# Patient Record
Sex: Male | Born: 1960
Health system: Southern US, Community
[De-identification: ages and names within clinical notes are randomized; demographics above are authoritative.]

## PROBLEM LIST (undated history)

## (undated) DIAGNOSIS — I10 Essential (primary) hypertension: Secondary | ICD-10-CM

## (undated) DIAGNOSIS — E119 Type 2 diabetes mellitus without complications: Secondary | ICD-10-CM

## (undated) DIAGNOSIS — N400 Enlarged prostate without lower urinary tract symptoms: Secondary | ICD-10-CM

## (undated) DIAGNOSIS — E785 Hyperlipidemia, unspecified: Secondary | ICD-10-CM

## (undated) HISTORY — DX: Benign prostatic hyperplasia without lower urinary tract symptoms: N40.0

## (undated) HISTORY — DX: Essential (primary) hypertension: I10

## (undated) HISTORY — DX: Type 2 diabetes mellitus without complications: E11.9

## (undated) HISTORY — DX: Hyperlipidemia, unspecified: E78.5

---

## 2008-03-24 ENCOUNTER — Ambulatory Visit: Payer: Self-pay | Admitting: Cardiology

## 2008-03-24 ENCOUNTER — Inpatient Hospital Stay (HOSPITAL_COMMUNITY): Admission: EM | Admit: 2008-03-24 | Discharge: 2008-04-04 | Payer: Self-pay | Admitting: Cardiology

## 2008-03-26 ENCOUNTER — Ambulatory Visit: Payer: Self-pay | Admitting: Thoracic Surgery (Cardiothoracic Vascular Surgery)

## 2008-04-22 ENCOUNTER — Ambulatory Visit: Payer: Self-pay | Admitting: Cardiothoracic Surgery

## 2008-04-22 ENCOUNTER — Encounter: Admission: RE | Admit: 2008-04-22 | Discharge: 2008-04-22 | Payer: Self-pay | Admitting: Cardiothoracic Surgery

## 2008-04-22 ENCOUNTER — Ambulatory Visit: Payer: Self-pay | Admitting: Cardiology

## 2008-05-12 ENCOUNTER — Ambulatory Visit: Payer: Self-pay | Admitting: Pulmonary Disease

## 2008-05-12 DIAGNOSIS — G4733 Obstructive sleep apnea (adult) (pediatric): Secondary | ICD-10-CM | POA: Insufficient documentation

## 2008-05-12 DIAGNOSIS — I219 Acute myocardial infarction, unspecified: Secondary | ICD-10-CM | POA: Insufficient documentation

## 2008-05-12 DIAGNOSIS — E785 Hyperlipidemia, unspecified: Secondary | ICD-10-CM

## 2008-05-24 ENCOUNTER — Ambulatory Visit (HOSPITAL_BASED_OUTPATIENT_CLINIC_OR_DEPARTMENT_OTHER): Admission: RE | Admit: 2008-05-24 | Discharge: 2008-05-24 | Payer: Self-pay | Admitting: Pulmonary Disease

## 2008-05-24 ENCOUNTER — Encounter: Payer: Self-pay | Admitting: Pulmonary Disease

## 2008-06-07 ENCOUNTER — Ambulatory Visit: Payer: Self-pay | Admitting: Pulmonary Disease

## 2008-06-10 ENCOUNTER — Ambulatory Visit: Payer: Self-pay | Admitting: Pulmonary Disease

## 2009-09-21 IMAGING — CR DG CHEST 1V PORT
1 series · 1 of 1 positions shown · non-contrast
Comparison: 03/29/2008

CLINICAL DATA: Post CABG for chest pain

PORTABLE CHEST - 1 VIEW

[view not recorded]
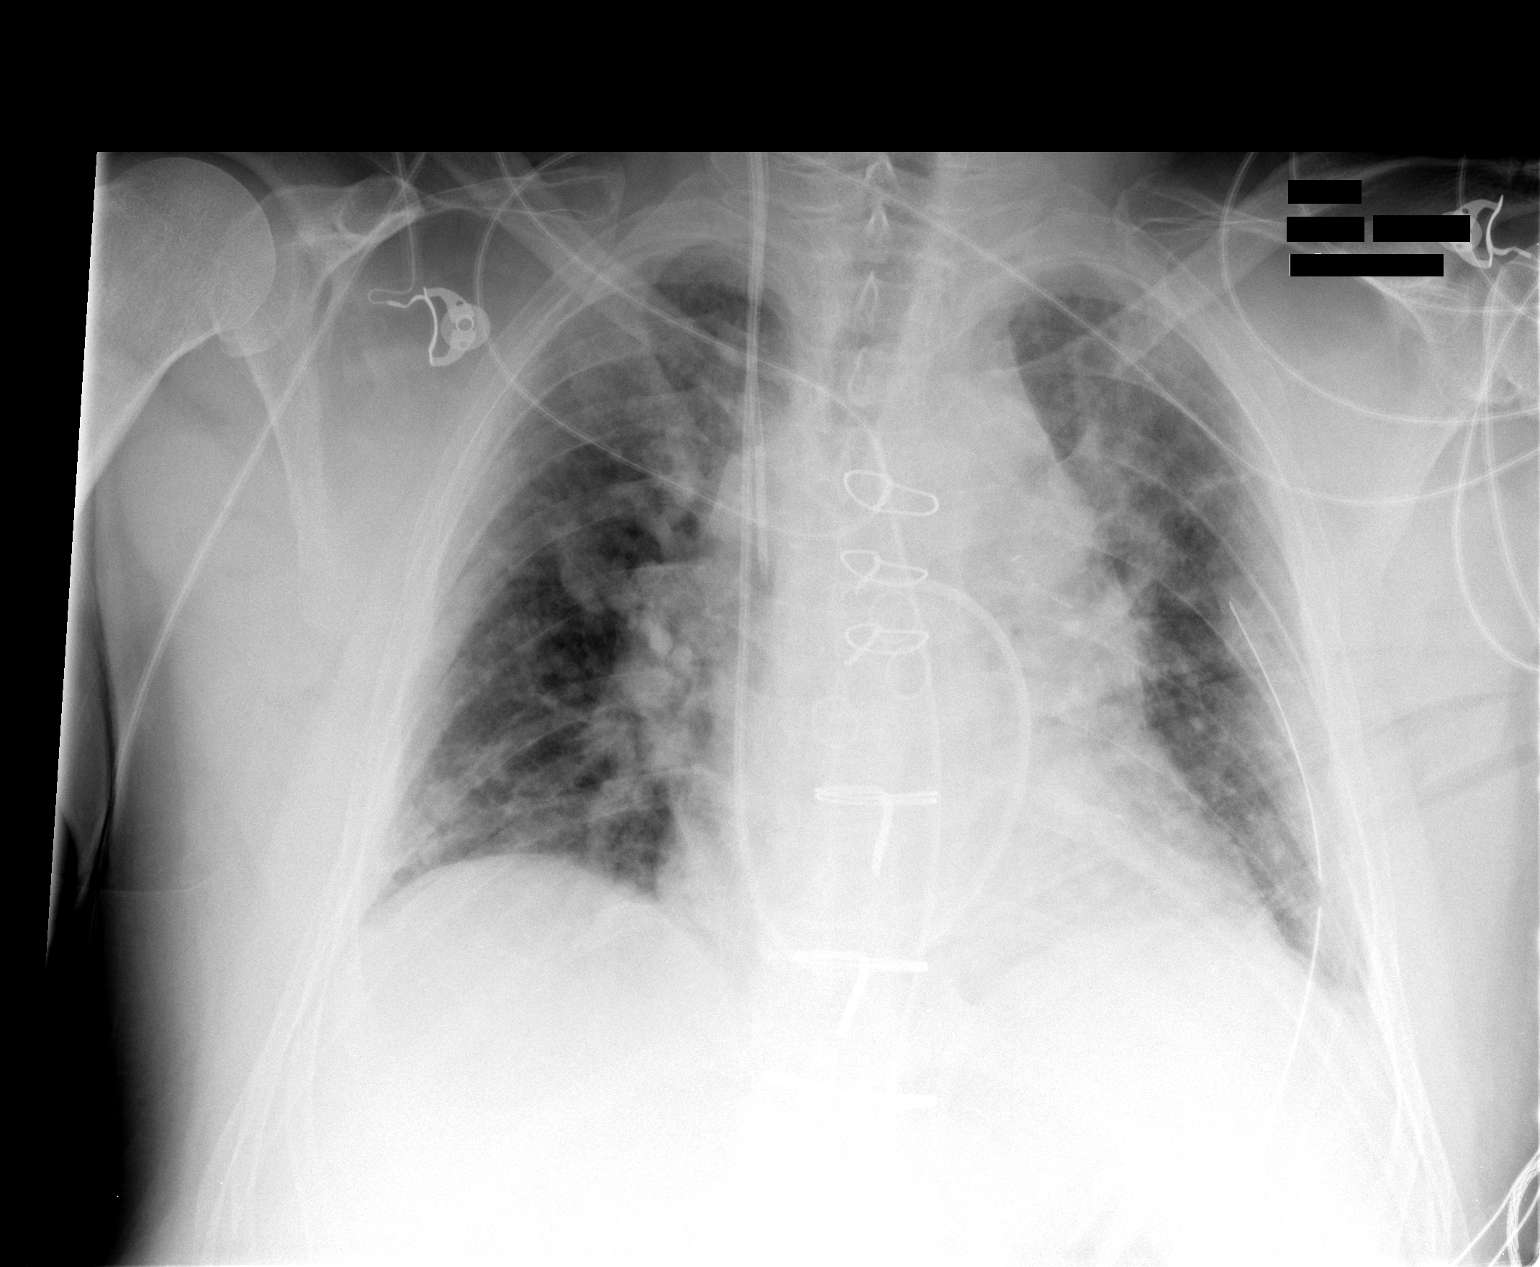

[1 of 1 positions shown; findings below may reference images not displayed]

FINDINGS: ET tube removed.  Other support apparatus remains in
place.

Slight interval worsening of vascular congestion.  No major
atelectasis or pneumothorax.

There is some air in the soft tissues over the left chest
indicating subcutaneous emphysema.  This appears to be a new
finding no visible pneumothorax.
IMPRESSION: 1.  ET tube removed.
2.  Slight increase in vascular congestion.
3.  Subcutaneous emphysema is now noted on the left, but there is
no visible pneumothorax.

## 2009-09-23 IMAGING — CR DG CHEST 2V
2 series · 2 of 2 positions shown · non-contrast
Comparison: 03/31/2008

CLINICAL DATA: Chest pain, prior CABG

CHEST - 2 VIEW

[w chest pa]
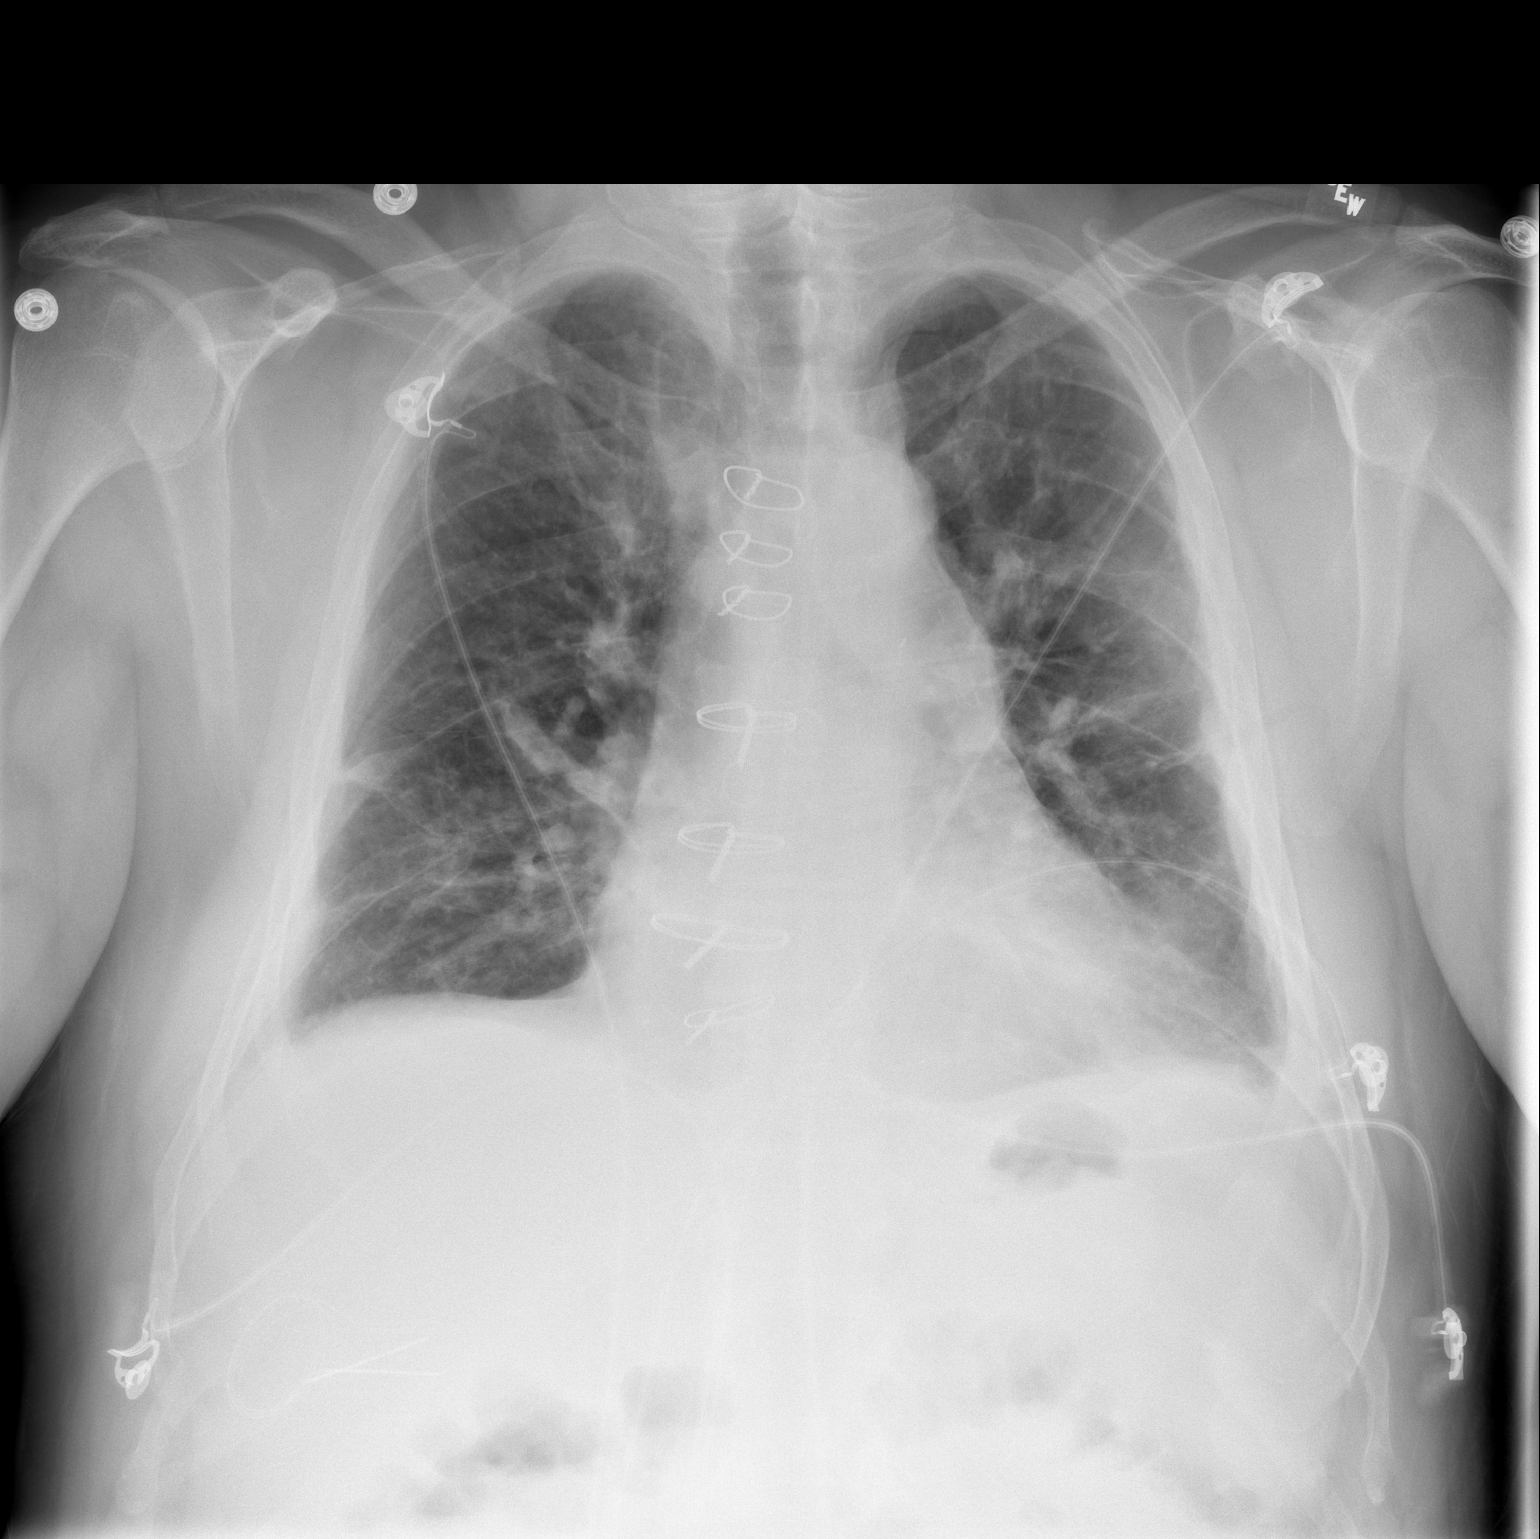

[w chest lat]
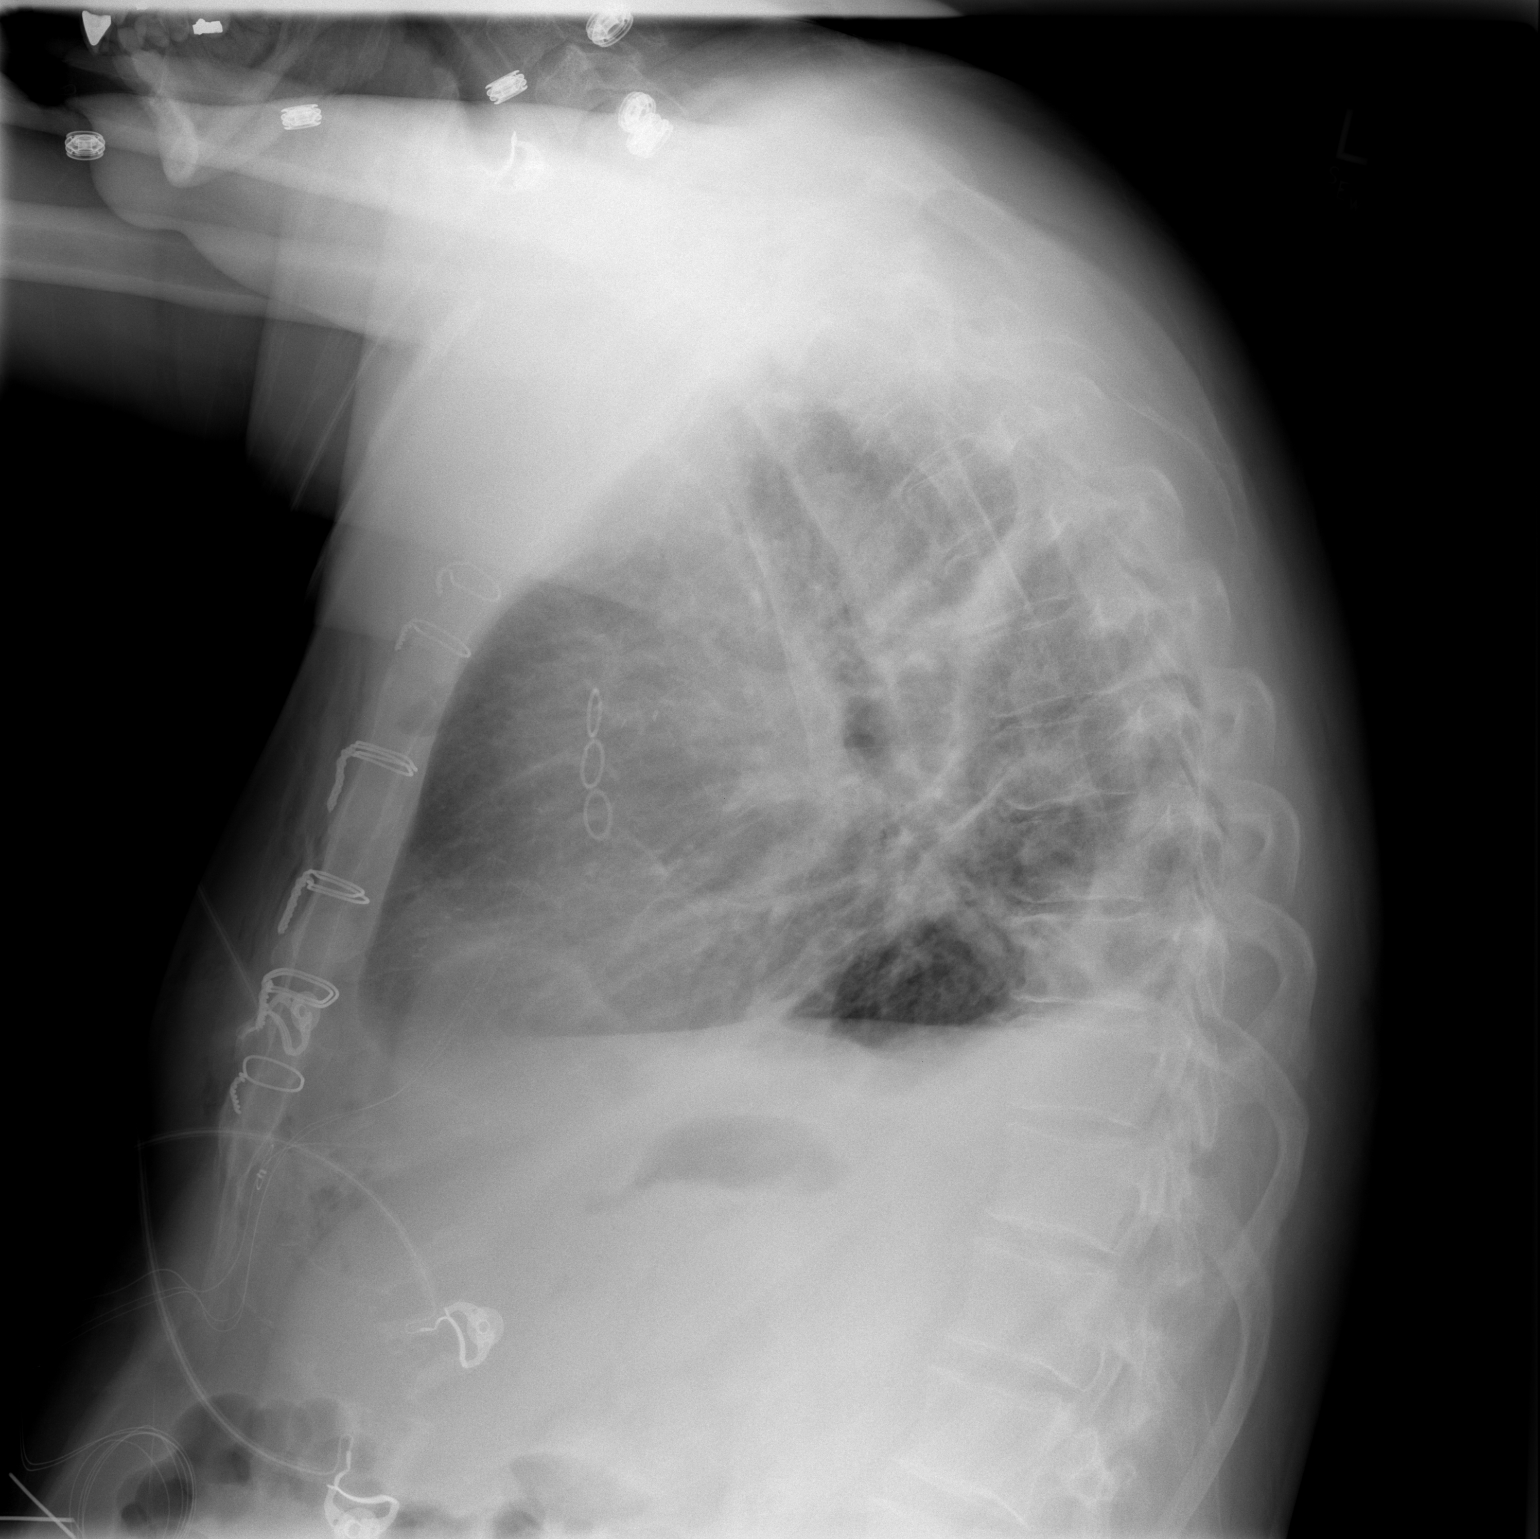

[2 of 2 positions shown; findings below may reference images not displayed]

FINDINGS: Interval removal of the right central line.  Previously
noted left pneumothorax has decreased, now very difficult to see.
Bibasilar atelectasis and small effusions have improved.  Mild
cardiomegaly.
IMPRESSION: Improving bibasilar opacities, effusions, and left pneumothorax.

## 2011-05-01 NOTE — Cardiovascular Report (Signed)
[name], [name]                 ACCOUNT NO.:  [id number]   MEDICAL RECORD NO.:  [id number]          PATIENT TYPE:  INP   LOCATION:  2014                         FACILITY:  [facility]   PHYSICIAN:  Arturo Morton. Riley Kill, MD, FACCDATE OF BIRTH:  [birth year]   DATE OF PROCEDURE:  DATE OF DISCHARGE:                            CARDIAC CATHETERIZATION   INDICATIONS:  [name] is a 50 year old gentleman, who has previously  undergone stenting.  This appears to be involving the circumflex and  right coronary arteries.  He presented with a non-ST-elevation MI, and  was transferred from [facility] Emergency Room to [facility] for further  evaluation.  He was seen by Dr. Daleen Squibb, and referred for cardiac  catheterization.  Of note, the patient has stopped his statin.  He has  also stopped his Plavix.  He does continue to smoke.   PROCEDURES:  1. Left heart catheterization.  2. Selective coronary arteriography.  3. Selective left ventriculography.  4. Distal aortography without runoff.   DESCRIPTION OF PROCEDURE:  The patient was brought to the  catheterization laboratory and prepped and draped in usual fashion  through an anterior puncture.  The femoral artery was entered, and a 5-  [place] sheath was initially placed.  We eventually upgraded to get a  better view of the coronaries.  The J-wire also coiled in the distal  aorta, and as a result we did a distal aortogram.  A guiding catheter  was used to inject the left coronary and a standard Judkins catheter  used to inject the right coronary artery.  There were no complications.  I then reviewed the options with the patient in detail, and prior to any  consideration for intervention, the patient wished to have discussion  with the cardiac surgeons.  ACT was checked and was found appropriate  for sheath removal, and the patient was taken to the holding area in  satisfactory clinical condition.   HEMODYNAMIC DATA.:  1. Central aortic pressure  107/65, mean 87.  2. Left ventricular pressure 115/11.  3. There was no gradient pullback across aortic the valve.   ANGIOGRAPHIC DATA.:  1. Ventriculography was done in the RAO projection.  Overall, systolic      function is preserved, and no segmental abnormalities or      contraction were identified.  Ejection fraction was felt to be at      least 55% to 60%.  2. Distal aortography revealed what appeared to be patent renal      arteries.  There is aortic widening distal to the renal artery      suggesting a small abdominal aneurysm or developing aneurysm.  3. There is moderate diffuse calcification by fluoroscopy, and      evidence of stenting in the circumflex and distal right coronaries.  4. The left main is without critical disease, but has about 20%      tapered narrowing distally.  5. The LAD has coarse irregularity throughout.  There is, after the      septal perforator, a 70% to 80% eccentric shelf noted in the LAD at  the takeoff of the diagonal.  Distal to this, the vessel opens up      and provides another diagonal and then LAD which is smaller in      caliber with 70% to 50%.  Of note, we cannot determine if there is      a missing branch vessel.  There is some collateralization from the      right coronary, specifically, the acute marginal branch that fills      a left-sided artery that would seem to occupy at least part of the      LAD territory.  6. There is a small ramus intermedius with about 30% to 40% narrowing.  7. The circumflex provides a first marginal that is fairly small in      caliber, it has about 50% proximal narrowing, then there is a long      stent with no restenosis, but with a 95% stenosis at the distal end      of the stent.  The AV circumflex has luminal irregularities but      without critical narrowing.  8. The right coronary artery has about 40% proximal narrowing that is      totally occluded in its midportion.  There is faint antegrade       filling into what appears to be a distal stent.  There is a large      acute marginal branch, which supplies the PDA distally.  The PDA      appears to be diffusely diseased, but may be underfilled.   CONCLUSIONS:  1. Preserved overall left ventricular function.  2. Total occlusion of the right coronary artery with collateralization      of the distal large PDA.  3. An 80% eccentric shelf involving the left anterior descending      artery as noted above.  4. High-grade stenosis distal to the previously placed circumflex      stent.  5. Possible small abdominal aortic aneurysm.   PLAN:  I have talked to the patient in some detail.  Options include  percutaneous intervention and/or revascularization surgery.  I have  asked Dr. Docia Chuck to see the patient in consultation, and we will  discuss potential options with the patient.  Notably, the patient has  not been highly compliant with medications in the past, and this could  affect long-term outcome with a stent strategy.  I have reviewed the  films with his wife.      Arturo Morton. Riley Kill, MD, [facility]  Electronically Signed     TDS/MEDQ  D:  03/26/2008  T:  03/27/2008  Job:  [id number]   cc:   Thomas C. Daleen Squibb, MD, [facility]  Learta Codding, MD,FACC  Newt Lukes, MD

## 2011-05-01 NOTE — Procedures (Signed)
NAMEJAYIN, [name] NO.:  [id number]   MEDICAL RECORD NO.:  [id number]          PATIENT TYPE:  OUT   LOCATION:  SLEEP CENTER                 FACILITY:  [facility]   PHYSICIAN:  Barbaraann Share, MD,FCCPDATE OF BIRTH:  22-Feb-1961   DATE OF STUDY:  05/24/2008                            NOCTURNAL POLYSOMNOGRAM   REFERRING PHYSICIAN:  Barbaraann Share, MD,FCCP   INDICATION FOR STUDY:  Hypersomnia with sleep apnea.   EPWORTH SLEEPINESS SCORE:  Is 12.   MEDICATIONS:   SLEEP ARCHITECTURE:  The patient had a total sleep time of 224 minutes,  with very little slow wave sleep or REM.  Sleep onset latency was  prolonged at 112 minutes and REM onset was very rapid at 31 minutes.  Sleep efficiency was very poor at 59%.   RESPIRATORY DATA:  The patient was found to have two obstructive apneas,  two central apneas and 32 obstructive hypopneas, for an AHI of 10 events  per hour.  The events were not positional, but they were clearly worse  during  REM.  There was also loud snoring noted throughout.   OXYGEN DATA:  There was O2 desaturation as low as 83% with the patient's  obstructive events.   CARDIAC DATA:  No clinically significant arrhythmias were noted.   MOVEMENT/PARASOMNIA:  The patient was found to have 47 periodic leg  movements with 3 per hour, resulting in arousal or awakening.   IMPRESSIONS/RECOMMENDATIONS:  1. Mild obstructive sleep apnea/hypopnea syndrome with an      apnea/hypopnea index of 10 events per hours and O2 desaturation as      low as 83%.  Treatment for this degree of sleep apnea can include      weight loss alone if applicable, upper airway surgery, oral      appliance and also CPAP.  Clinical correlation is suggested.  2. Moderate numbers of periodic leg movements with 3 per hour      resulting in arousal or awakening.  Again, clinical correlation is      suggested, to see if this may be impacting the patient's sleep.      Barbaraann Share,  MD,FCCP  Diplomate, American Board of Sleep  Medicine  Electronically Signed     KMC/MEDQ  D:  06/08/2008 17:15:28  T:  06/08/2008 17:33:50  Job:  [id number]

## 2011-05-01 NOTE — H&P (Signed)
[name], [name] NO.:  [id number]   MEDICAL RECORD NO.:  [id number]          PATIENT TYPE:  INP   LOCATION:  2014                         FACILITY:  [facility]   PHYSICIAN:  Vernice Jefferson, MD          DATE OF BIRTH:  May 01, 1961   DATE OF ADMISSION:  03/24/2008  DATE OF DISCHARGE:                              HISTORY & PHYSICAL   CARDIOLOGIST:  The patient's cardiologist is in [facility].   CHIEF COMPLAINT:  Chest pain x6 hours.   HISTORY OF PRESENT ILLNESS:  The patient is a 50 year old white male  with an early cardiovascular, coronary artery disease history.  The  patient reports that his first stent was placed 11 years ago, he does  not know the type or the vessel.  With a repeat stenting procedure 5  years ago with complaints of chest pain.  He reports that he has not had  any chest pain or pressure for over 5 years since his last stent was  placed.  He reports his chest pain occurred while at rest today.  It was  substernal, pressure-like, radiated to both of his arms.  He reports he  took a nitroglycerin and it resolved the pain somewhat and with the  second nitroglycerin the pain did not go away and he presented to the  [facility] emergency room.  He reports that he has gotten pain free, but  now the chest pain has started to recur a little bit now on a  nitroglycerin drip.  He reports that there is no chest pain history  prior to this or decreased exertional tolerance.  No PND, no orthopnea,  no lower extremity edema.   PAST MEDICAL HISTORY:  1. Coronary artery disease, status post PCI, he states with stents x2,      however, does not know the vessel nor type.  He said he lost his      card, performed in [facility].  2. He reports hyperlipidemia.   MEDICATIONS:  He reports he takes an aspirin only a day 81 mg.   SOCIAL HISTORY:  Current smoker at a pack and a half a day.  No alcohol.  Denies any drug use.   FAMILY HISTORY:  States he does have  coronary artery disease in his  family, but does not report any early coronary disease in his family.   ALLERGIES:  NO KNOWN DRUG ALLERGIES.   REVIEW OF SYSTEMS:  Negative 11-point review of systems except for what  is dictated in the above HPI.   PHYSICAL EXAMINATION:  VITAL SIGNS:  Blood pressure 136/71, heart rate  63, afebrile.  GENERAL:  A well-developed, well-nourished white male in no acute  distress.  HEENT:  Moist mucous membranes.  No scleral icterus.  No conjunctival  pallor.  NECK:  Supple.  Full range of motion.  No jugulovenous distention.  No  carotid bruits.  CARDIOVASCULAR:  Regular rate and rhythm without murmurs, rubs or  gallops.  CHEST:  Clear to auscultation bilaterally.  No wheezing, rales or  rhonchi.  ABDOMEN:  Soft, nontender, nondistended.  Normoactive bowel sounds.  EXTREMITIES:  No peripheral edema.  Pulses 2+ bilaterally.  SKIN:  No rashes, no lesions.  NEURO:  Cranial nerves II through XII grossly intact and no focal  deficits noted.   EKG demonstrates sinus bradycardia with early repolarization inferior  leads, but no acute ST-T wave changes.  Chest x-ray per ER report was  read as no acute infiltrate or process.   IMPRESSION:  1. Unstable angina.  2. History of coronary artery disease.  3. Tobacco abuse.   PLAN:  Admit the patient to Dr. Vern Claude service with serial biomarkers,  telemetry monitoring.  Additionally, the patient's has already been  started on a heparin drip and want to continue that until his biomarkers  become negative.  He is on a nitroglycerin drip for chest pain.  I will  also add morphine to his regimen.  Initiate statin therapy and beta-  blocker for heart rate in the 60s.  Blood pressure is pretty well  controlled now.  Will check a urine tox screen just to make sure there  is no cocaine on-board that may be contributing to this as well.  Keep  his n.p.o. for further risk stratification.      Vernice Jefferson, MD   Electronically Signed     JT/MEDQ  D:  03/24/2008  T:  03/25/2008  Job:  [id number]

## 2011-05-01 NOTE — Discharge Summary (Signed)
[name], AL NO.:  [id number]   MEDICAL RECORD NO.:  [id number]          PATIENT TYPE:  INP   LOCATION:  2016                         FACILITY:  [facility]   PHYSICIAN:  Sheliah Plane, MD    DATE OF BIRTH:  1961-01-22   DATE OF ADMISSION:  03/24/2008  DATE OF DISCHARGE:                               DISCHARGE SUMMARY   PRIMARY ADMITTING DIAGNOSIS:  Chest pain.   ADDITIONAL /DISCHARGE DIAGNOSES:  1. Severe three-vessel coronary artery disease.  2. Class IV unstable angina.  3. Non-ST elevation myocardial infarction.  4. History of coronary artery disease status post 2 prior PTCA and      stents.  5. Hyperlipidemia.  6. Ongoing tobacco abuse.   PROCEDURES PERFORMED:  1. Cardiac catheterization.  2. Coronary artery bypass grafting x4 (left internal mammary artery to      the LAD, saphenous vein graft to the posterior descending,      saphenous vein graft to the second obtuse marginal, and saphenous      vein graft to the second diagonal).  3. Endoscopic vein harvest, right leg.   HISTORY:  The patient is a 50 year old white male with a known history  of coronary artery disease.  He is status post 2 previous PTCA and  stents, most recently in 2004.  He has not had regular followup since  that time and is on no medications at home.  He had been in his usual  state of health until March 24, 2008 at which time he developed sudden  onset substernal chest pain with associated shortness of breath.  He  took an aspirin and sublingual nitroglycerin with mild relief, but  continued to have chest pain.  He ultimately presented to the emergency  department at [facility] where he was found to have mildly  elevated inferior ST segments.  His initial cardiac enzymes were  negative, but he subsequently developed elevated troponin levels and a  mildly elevated CK-MB.  He was started on heparin and nitroglycerin and  was transferred to [name] for  cardiac catheterization and  further cardiac workup.   HOSPITAL COURSE:  The patient was transferred to [facility] on March 24, 2008 under the care of Dr. Valera Castle.  He was continued on heparin and  Integrilin and underwent cardiac catheterization on March 26, 2008 which  showed severe three-vessel coronary artery disease with preserved left  ventricular function.  There was a 70-80% stenosis of the mid LAD at the  takeoff of the diagonal.  There was 95% stenosis of the circumflex  marginal branch at the terminal end of a prior stent, 100% occlusion in  the right coronary artery with collateral filling of the PDA and an  occluded stent in the distal right coronary artery.  Because of these  findings, a cardiac surgery consultation was obtained.  The patient was  seen initially by Dr. Tressie Stalker who felt that he would benefit from  surgical revascularization.  He explained the risks, benefits, and  alternatives of the surgery to the patient and he agreed  to proceed.  Because of the OR schedule, Dr. Tyrone Sage had an availability and saw the  patient and agreed with Dr. Orvan July initial consultation.  Mr. Danh  remained stable in the hospital during this time and remained pain free.  He was taken to the operating room on March 29, 2008 and underwent CABG  x4 as described in detail above performed by Dr. Tyrone Sage.  He tolerated  the procedure well and was transferred to the SICU in stable condition.  He was able to be extubated shortly after surgery.  He was  hemodynamically stable and doing well on postop day #1.  At that time,  his chest tubes and hemodynamic monitoring devices were removed.  He was  kept in the unit for overnight observation and by postop day #2, he was  ready for transfer to the floor.  Overall, his postoperative course has  been uneventful.  He has been volume overloaded and was started on Lasix  to which he has responded well.  He is currently below his  preoperative  weight with no significant lower extremity edema on physical exam.  His  incisions are all healing well.  He has been started on a beta blocker  and a statin and is tolerating these without problem.  He is ambulating  in the halls with cardiac rehab phase I as well as independently and is  making good progress.  He is tolerating a regular diet and is having  normal bowel and bladder function.  His most recent labs show hemoglobin  12.5, hematocrit 36.8, white count 9.7, platelets 246, sodium 137,  potassium 3.8, BUN 12, and creatinine 1.16.  He will be observed over  the next 24 hours and evaluated on morning rounds on April 03, 2008.  Hopefully at that time, if he has remained stable, he will be ready for  discharge to home.   DISCHARGE MEDICATIONS:  1. Enteric-coated aspirin 325 mg daily.  2. Toprol XL 25 mg daily.  3. Zocor 40 mg nightly.  4. Oxycodone 5 mg 1-2 q.4-6 h p.r.n. for pain.   DISCHARGE INSTRUCTIONS:  He is asked to refrain from driving, heavy  lifting, or strenuous activity.  He may continue ambulating daily and  using his incentive spirometer.  He may shower daily and clean his  incisions with soap and water.  He will continue with a low-fat, low-  sodium diet.   DISCHARGE FOLLOWUP:  He has been counseled regarding smoking cessation  and been given contact information on outpatient programs.  He is asked  to make an appointment see Dr. Daleen Squibb in 2 weeks.  He will then follow up  with Dr. Tyrone Sage on Apr 22, 2008 with a chest x-ray from [facility]  imaging.  In the interim if he experiences problems or has questions, he  is asked to contact our office.      Coral Ceo, P.A.      Sheliah Plane, MD  Electronically Signed    GC/MEDQ  D:  04/02/2008  T:  04/03/2008  Job:  [id number]   cc:   Thomas C. Wall, MD, [facility]

## 2011-05-01 NOTE — Op Note (Signed)
[name], [name] NO.:  [id number]   MEDICAL RECORD NO.:  [id number]          PATIENT TYPE:  INP   LOCATION:  2016                         FACILITY:  [facility]   PHYSICIAN:  Sheliah Plane, MD    DATE OF BIRTH:  01-22-1961   DATE OF PROCEDURE:  03/29/2008  DATE OF DISCHARGE:                               OPERATIVE REPORT   PREOPERATIVE DIAGNOSIS:  Coronary occlusive disease with unstable  angina.   POSTOPERATIVE DIAGNOSIS:  Coronary occlusive disease with unstable  angina.   SURGICAL PROCEDURES:  Coronary artery bypass grafting x4 with left  internal mammary to the left anterior descending coronary artery,  reversed saphenous vein graft to the second diagonal coronary artery,  reversed saphenous vein graft to the second obtuse marginal, and  reversed saphenous vein graft to the posterior descending coronary  artery.   SURGEON:  Sheliah Plane, MD   FIRST ASSISTANT:  Zadie Rhine, PA   BRIEF HISTORY:  The patient is a 50 year old male who has previously  undergone angioplasties of the right coronary artery and the second  obtuse marginal coronary artery.  He re-presented with unstable anginal  symptoms, underwent cardiac catheterization by Dr. Riley Kill revealing  preserved LV function, restenosis of the stent placed in the second  obtuse marginal, total occlusion of the previously stented right  coronary artery, and high-grade stenosis greater than 95% in the LAD  which is a complex lesion and not suitable for angioplasty, coronary  artery bypass grafting was recommended to the patient who agreed and  signed informed consent.   DESCRIPTION OF PROCEDURE:  With Swan-Ganz and arterial line monitors in  place, the patient underwent general endotracheal anesthesia without  incident.  Skin of the chest and lungs was prepped with Betadine and  draped in the usual sterile manner.  Using the guidant endovein  harvesting system, the vein was harvested from the  right thigh, and calf  was of good quality and caliber.  Median sternotomy was performed, and  left internal mammary artery was dissected down as pedicle graft.  Distal artery was divided and had good free flow.  The pericardium was  opened.  Overall, ventricular function appeared preserved.  The patient  was systemically heparinized.  Ascending aorta and the right atrium  cannulated and aortic root vent cardioplegia needle was introduced into  the ascending aorta.  The patient was placed on cardiopulmonary bypass  2.4 liters per minute per meter squared.  Sites of anastomosis were  selected and dissected out of the epicardium.  It should be noted that  the posterior descending was very small artery, as was the first  diagonal.  The posterior descending was bypassed.  The first diagonal  was too small for bypass.  Aortic crossclamp was applied and 500 mL of  cold blood potassium cardioplegia was administered with rapid diastolic  arrest.  The heart myocardial septal temperatures monitored after  crossclamp.   Attention was turned first to the posterior descending coronary artery,  which was totally occluded.  The vessel was opened and admitted a 1-mm  probe distally using a  running 7-0 Prolene, and distal anastomosis was  performed.  The second reversed saphenous vein graft harvest was then  elevated in the second obtuse marginal which had the stent in it, was  located and was partially intramyocardial.  The vessel was opened and  admitted 1-mm probe distally.  Overall, the vessel was small.  Using a  running 7-0 Prolene, a distal anastomosis was performed.  Additional  cold blood cardioplegia was administered intermittently down the vein  grafts.   Attention was then turned to the second diagonal, which was 1.2 to 1.3  mm in size.  Using a running 7-0 Prolene, distal anastomosis was  performed with a segment of reversed saphenous vein graft.  Attention  was then turned to the left  anterior descending coronary, which was  opened between the mid and distal third.  Using running 8-0 Prolene,  left internal mammary artery was anastomosed to the left anterior  descending coronary artery.  With the crossclamp still in place, three  puncture aortotomies were performed in the ascending aorta, each of 3  vein grafts were anastomosed to the ascending aorta.  Air was evacuated  from the grafts.  Aortic crossclamp was removed.  Total crossclamp time  was 84 minutes.  The patient required electrical defibrillation.  The  patient spontaneously converted to a sinus rhythm.  He remained  hemodynamically stable.   Sites of anastomosis were inspected free of bleeding.  He was then  ventilated and weaned from cardiopulmonary bypass.  On low-dose  dopamine, he remained hemodynamically stable, was decannulated in the  usual fashion.  Protamine sulfate was administered with the operative  field hemostatic.  Two atrial and two ventricular pacing wires applied.  Graft markers applied.  A left pleural tube and Blake mediastinal drain  were left in place.  Pericardium was reapproximated.  Sternum closed  with #6 stainless steel wire.  Fascia closed with interrupted 0 Vicryl  and 3-0 Vicryl subcutaneous tissue, 4-0 subcuticular stitch in skin  edges.  Dry dressings were applied.  Sponge and needle count was  reported as correct at the completion of the procedure.  The patient  tolerated the procedure without obvious complication and was transferred  to the surgical intensive care unit for further postoperative care.      Sheliah Plane, MD  Electronically Signed     EG/MEDQ  D:  04/02/2008  T:  04/02/2008  Job:  [id number]   cc:   Thomas C. Wall, MD, [facility]

## 2011-05-01 NOTE — Assessment & Plan Note (Signed)
OFFICE VISIT   [name], [name]  DOB:  28-Jan-1961                                        Apr 22, 2008  CHART #:  [id number]   [name] returns today for follow-up visit following coronary artery  bypass grafting x4 done on March 29, 2008.  The patient is a 50 year old  male who presented with unstable anginal symptoms after undergoing  previous angioplasties with preserved LV function.  The patient was a  heavy smoker and was smoking up to the time of surgery.  Ultimately he  was discharged home on oxygen at night because of decreasing  saturations.  There was a discussion about sleep apnea and need for a  sleep study preoperatively.  Currently, the patient is increasing his  physical activity appropriately.  He has no overt symptoms of recurrent  angina or congestive heart failure.   ON EXAM.:  His blood pressure 131/81, pulse is 96, heart rate is 18, O2  sats 92% on room air.  He continues to use home oxygen at nighttime.  His wife describes sleep apnea-type symptoms in his sleep pattern.  STERNUM:  Is stable and well healed.  LUNGS:  Are clear bilaterally.  ENDO-VEIN HARVEST SITE:  Is also healing well.  He has no pedal edema.   Follow-up chest x-ray shows small left pleural effusion clearing of the  right lung.  Overall improved from his early postop films.   Overall he is progressing satisfactorily.  Prior to discontinuing his  oxygen, we will make arrangements for him to see Dr. Shelle Iron for [facility] for sleep study and I have also encouraged him to enroll in the  cardiac rehab program at [facility].   CURRENT MEDICATIONS INCLUDE:  1. Toprol XL 25 mg a day.  2. Zocor 40 mg a day.  3. Aspirin 325 mg a day.   Sheliah Plane, MD  Electronically Signed   EG/MEDQ  D:  04/22/2008  T:  04/22/2008  Job:  [id number]   cc:   Arturo Morton. Riley Kill, MD, [facility]  Barbaraann Share, MD,FCCP

## 2011-05-01 NOTE — Assessment & Plan Note (Signed)
[facility]                          [facility] OFFICE NOTE   NAME:[name] [name] CAPUANO                        MRN:          [id number]  DATE:04/22/2008                            DOB:          [birth year]    REFERRING PHYSICIAN:  Dr. Olena Leatherwood.   HISTORY OF PRESENT ILLNESS:  The patient is a 50 year old male with  recent non-ST-elevation myocardial infarction.  The patient was  diagnosed with multiple coronary artery disease after catheterization.  He also has prior history of PTCA and stent placement.  The patient,  unfortunately, continues to smoke and has dyslipidemia.  During his most  recent hospitalization the patient underwent coronary artery bypass  grafting with a LIMA to the LAD, a saphenous vein graft to the posterior  descending artery, a saphenous vein graft to the second obtuse marginal,  a saphenous vein graft to the second diagonal.  The patient has been  doing well.  He reports no recurrent substernal chest pain, shortness of  breath, orthopnea, PND.  His 12-lead electrogram in the office today  demonstrates normal sinus rhythm with no acute changes.   MEDICATIONS:  1. Aspirin 325 mg p.o. daily.  2. Toprol-XL 25 mg p.o. daily.  3. Zocor 40 mg p.o. daily.   PHYSICAL EXAMINATION:  VITAL SIGNS:  Blood pressure 127/82, heart rate  88 beats per minute, weight is 209 pounds.  NECK EXAM:  Normal carotid upstroke and no carotid bruits.  LUNGS:  Clear breath sounds bilaterally.  HEART:  Regular rate and rhythm, normal S1 and S2.  No murmurs, rubs or  gallops.  ABDOMEN:  Soft.  EXTREMITY EXAM:  No cyanosis, clubbing or edema.  NEURO:  The patient alert, oriented and grossly nonfocal.   PROBLEMS:  1. Severe three-vessel coronary status post coronary artery bypass      graft.  2. Status post non-ST-elevation myocardial infarction.  3. Preserved left ventricular function.  4. Dyslipidemia.  5. Tobacco use.   PLAN:  1. The patient's EKG  was reviewed and appears to be within normal      limits.  There is no evidence of arrhythmia.  2. The patient has no recurrence of substernal chest pain.  He is      planning to see Dr. Tyrone [name] later today.  A chest x-ray has been      ordered.     [name] Codding, MD,FACC  Electronically Signed    GED/MedQ  DD: 04/25/2008  DT: 04/25/2008  Job #: [id number]   cc:   [name] [name]

## 2011-05-01 NOTE — Consult Note (Signed)
NAMETESHAUN, OLARTE                 ACCOUNT NO.:  [id number]   MEDICAL RECORD NO.:  [id number]          PATIENT TYPE:  INP   LOCATION:  2014                         FACILITY:  [facility]   PHYSICIAN:  Salvatore Decent. Cornelius Moras, M.D. DATE OF BIRTH:  08/22/1961   DATE OF CONSULTATION:  03/26/2008  DATE OF DISCHARGE:                                 CONSULTATION   REQUESTING PHYSICIAN:  Arturo Morton. Riley Kill, MD, [facility]   REASON FOR CONSULTATION:  Severe 3-vessel coronary artery disease with  class IV unstable angina.   HISTORY OF PRESENT ILLNESS:  Mr. Goodchild is a 50 year old male from  [place], [place] with known history of coronary artery disease  status post percutaneous coronary intervention and stenting on two  previous occasions, most recently in 2004.  Recently, the patient has  not been seen in followup by any medical doctors and he has not been  taking any medications other than aspirin.  The patient continues to  smoke.  The patient has known hypercholesterolemia.  The patient states  that he is in his usual state of health until the evening of April 8, at  which time he developed sudden onset of substernal chest pain associated  with shortness of breath.  The pain radiated to both arms.  The patient  was very similar to pain he had suffered from at the time of previous  presentation with acute coronary syndrome in the past.  Initially, he  took aspirin and sublingual nitroglycerin with some relief, but the pain  promptly returned.  He presented to the emergency department at [facility] in [place].  Initial EKGs revealed mild inferior ST-segment  elevation.  Initial cardiac enzymes were negative but the patient  subsequently developed mildly elevated troponin levels and mildly  elevated CK-MB.  The pain subsided with heparin and nitroglycerin.  The  patient was transferred to [facility] where he was  taken for cardiac catheterization today by Dr. Riley Kill.  Findings at  the  time of catheterization demonstrate severe 3-vessel coronary artery  disease with preserved left ventricular function.  Cardiothoracic  surgical consultation has been requested.  The patient has remained free  of any symptoms of chest pain or shortness of breath subsequent to his  arrival at [facility].   REVIEW OF SYSTEMS:  GENERAL:  The patient reports normal appetite.  He  has not been gaining or losing weight recently.  He reports no exercise  intolerance.  CARDIAC:  Notable for the absence of any previous episodes  of chest pain or chest tightness prior to that which developed on April  8.  The patient has been active physically and has not suffered from  exertional shortness of breath.  He denies PND, orthopnea, lower  extremity edema.  RESPIRATORY:  Negative.  The patient denies productive  cough, hemoptysis, wheezing.  GASTROINTESTINAL:  Negative.  The patient  has no difficulty swallowing.  He denies hematochezia, hematemesis,  melena.  MUSCULOSKELETAL:  Notable for mild arthralgias afflicting the  fingers of both hands.  NEUROLOGIC:  Negative.  HEENT:  Negative.  ENDOCRINE:  Negative.   PAST MEDICAL HISTORY:  1. Coronary artery disease status post percutaneous coronary      intervention and stenting x2 in the past (performed in [facility], details unclear).  2. Hyperlipidemia.  3. Longstanding tobacco abuse.   PAST SURGICAL HISTORY:  None.   FAMILY HISTORY:  Notable that both the patient's parents had heart  attacks in their 39s.   SOCIAL HISTORY:  The patient is married and lives in [place].  He works  doing Holiday representative work.  He has a longstanding history of tobacco abuse  smoking approximately 1 and 1-1/2-pack of cigarettes per day since  childhood.  The patient denies excessive alcohol consumption.   MEDICATIONS PRIOR TO ADMISSION:  Aspirin 81 mg daily.   DRUG ALLERGIES:  None known.  The patient does report sensitivity to  STATINS including LIPITOR  which have caused myalgias and arthralgias in  the past.   PHYSICAL EXAMINATION:  GENERAL:  The patient is a well-appearing male  who appears his stated age in no acute distress.  VITAL SIGNS:  The patient is 5 feet 10 inches tall and weighs  approximately 99.6 kg.  The patient is afebrile and normotensive.  HEENT:  Grossly unrevealing.  NECK:  Supple.  There is no cervical nor supraclavicular  lymphadenopathy.  There is no jugular venous distention.  No carotid  bruits were noted.  CHEST:  Auscultation of the chest demonstrates clear breath sounds which  are symmetrical bilaterally.  No wheezes or rhonchi noted.  CARDIOVASCULAR:  Regular rate and rhythm.  No murmurs, rubs, or gallops  were appreciated.  ABDOMEN:  The abdomen is soft, nontender.  There are no palpable masses.  Bowel sounds are present.  EXTREMITIES:  Warm and well-perfused.  There is no lower extremity  edema.  Distal pulses are easily palpable and symmetrical in the  posterior tibial position bilaterally.  Palmar arch circulation in the  left hand appears intact with normal Allen's test.  NEUROLOGIC:  Grossly nonfocal and symmetrical throughout.  RECTAL:  Deferred.  GU:  Deferred.   DIAGNOSTIC TESTS:  Cardiac catheterization performed by Dr. Riley Kill  today is reviewed.  This demonstrates severe 3-vessel coronary artery  disease with preserved left ventricular function.  Specifically, there  is 70%-80% stenosis of mid left anterior descending coronary artery  arising at takeoff of the diagonal branch.  There is a patent stent in  the first circumflex marginal branch with 95% stenosis of the circumflex  marginal branch at the terminal end of the previous stent.  There is a  100% occlusion of the right coronary artery with right-to-right  collateral filling of the posterior descending coronary artery.  There  appeared to be an old stent in the distal right coronary artery that is  occluded.  Left ventricular function  appears normal with no significant  wall motion abnormalities.   IMPRESSION:  Severe 3-vessel coronary artery disease with acute coronary  syndrome and preserved left ventricular function.  I believe that Mr.  Campos would best be treated with surgical revascularization.   PLAN:  I have discussed options at length with Mr. President and his  family this afternoon.  Alternative treatment strategies have been  discussed.  They understand and accept all associated risks of surgery  including but not limited to risk of death, stroke, myocardial  infarction, congestive heart failure, respiratory failure, pneumonia,  bleeding requiring blood transfusion, arrhythmia, infection, and  recurrent coronary artery disease.  The patient also  understands how  important it will be for him to find a way to quit smoking completely  and to continue to monitor  his cholesterol status indefinitely and pay a bit more attention to all  of his underlying medical problems.  All of his questions have been  addressed.  We will tentatively plan to proceed with surgery on Monday,  April 13 by Dr. Tyrone Sage.      Salvatore Decent. Cornelius Moras, M.D.  Electronically Signed     CHO/MEDQ  D:  03/26/2008  T:  03/27/2008  Job:  [id number]   cc:   Thomas C. Wall, MD, [facility]

## 2011-09-11 LAB — PROTIME-INR
INR: 0.9
INR: 1.3
Prothrombin Time: 12.5
Prothrombin Time: 16.5 — ABNORMAL HIGH

## 2011-09-11 LAB — POCT I-STAT 3, ART BLOOD GAS (G3+)
Acid-Base Excess: 2
Acid-base deficit: 2
Bicarbonate: 26.8 — ABNORMAL HIGH
Bicarbonate: 28.3 — ABNORMAL HIGH
Bicarbonate: 28.4 — ABNORMAL HIGH
Bicarbonate: 29.9 — ABNORMAL HIGH
O2 Saturation: 91
O2 Saturation: 98
Operator id: 180381
Operator id: 3342
Operator id: 3342
Patient temperature: 35.9
TCO2: 25
TCO2: 28
TCO2: 30
pCO2 arterial: 45.6 — ABNORMAL HIGH
pCO2 arterial: 50.7 — ABNORMAL HIGH
pCO2 arterial: 51.8 — ABNORMAL HIGH
pCO2 arterial: 63.6
pH, Arterial: 7.28 — ABNORMAL LOW
pH, Arterial: 7.328 — ABNORMAL LOW
pH, Arterial: 7.346 — ABNORMAL LOW
pH, Arterial: 7.356
pO2, Arterial: 105 — ABNORMAL HIGH
pO2, Arterial: 451 — ABNORMAL HIGH
pO2, Arterial: 90

## 2011-09-11 LAB — CBC
HCT: 31.5 — ABNORMAL LOW
HCT: 35 — ABNORMAL LOW
HCT: 37.2 — ABNORMAL LOW
HCT: 38.9 — ABNORMAL LOW
HCT: 47.4
HCT: 36.2 — ABNORMAL LOW
HCT: 36.5 — ABNORMAL LOW
HCT: 37 — ABNORMAL LOW
Hemoglobin: 10.9 — ABNORMAL LOW
Hemoglobin: 11.9 — ABNORMAL LOW
Hemoglobin: 12.9 — ABNORMAL LOW
Hemoglobin: 13.2
Hemoglobin: 16.1
Hemoglobin: 17.5 — ABNORMAL HIGH
Hemoglobin: 12.5 — ABNORMAL LOW
Hemoglobin: 12.5 — ABNORMAL LOW
Hemoglobin: 12.9 — ABNORMAL LOW
MCHC: 34
MCHC: 34.2
MCHC: 34.3
MCHC: 34.6
MCHC: 34.6
MCHC: 35.1
MCHC: 35.3
MCHC: 33.7
MCHC: 34.6
MCHC: 35.4
MCV: 90.3
MCV: 90.4
MCV: 90.4
MCV: 90.5
MCV: 90.6
MCV: 90.6
MCV: 90.8
MCV: 91.2
MCV: 91.1
MCV: 91.3
MCV: 92.6
Platelets: 135 — ABNORMAL LOW
Platelets: 136 — ABNORMAL LOW
Platelets: 150
Platelets: 156
Platelets: 240
Platelets: 148 — ABNORMAL LOW
Platelets: 172
Platelets: 246
RBC: 3.47 — ABNORMAL LOW
RBC: 3.86 — ABNORMAL LOW
RBC: 4.12 — ABNORMAL LOW
RBC: 4.29
RBC: 5.39
RBC: 5.45
RBC: 5.53
RBC: 3.96 — ABNORMAL LOW
RBC: 3.99 — ABNORMAL LOW
RBC: 4.01 — ABNORMAL LOW
RDW: 13.7
RDW: 13.7
RDW: 13.7
RDW: 13.8
RDW: 13.9
RDW: 14
RDW: 13.6
RDW: 14
RDW: 14.1
WBC: 10.1
WBC: 11.4 — ABNORMAL HIGH
WBC: 12.5 — ABNORMAL HIGH
WBC: 13.2 — ABNORMAL HIGH
WBC: 8.9
WBC: 9.1
WBC: 10.4
WBC: 12.3 — ABNORMAL HIGH
WBC: 9.7

## 2011-09-11 LAB — POCT I-STAT 4, (NA,K, GLUC, HGB,HCT)
Glucose, Bld: 99
HCT: 33 — ABNORMAL LOW
HCT: 47
Hemoglobin: 11.2 — ABNORMAL LOW
Hemoglobin: 14.6
Hemoglobin: 16
Operator id: 3342
Operator id: 3342
Potassium: 4.8
Potassium: 5
Potassium: 5.3 — ABNORMAL HIGH
Potassium: 5.5 — ABNORMAL HIGH
Potassium: 7.1
Sodium: 126 — ABNORMAL LOW
Sodium: 136
Sodium: 136

## 2011-09-11 LAB — URINALYSIS, ROUTINE W REFLEX MICROSCOPIC
Bilirubin Urine: NEGATIVE
Nitrite: NEGATIVE
Specific Gravity, Urine: 1.021
pH: 7.5

## 2011-09-11 LAB — POCT I-STAT, CHEM 8
Calcium, Ion: 1.26
Glucose, Bld: 102 — ABNORMAL HIGH
Glucose, Bld: 125 — ABNORMAL HIGH
HCT: 37 — ABNORMAL LOW
HCT: 42
Hemoglobin: 12.6 — ABNORMAL LOW
Hemoglobin: 14.3
Potassium: 4.6
Potassium: 4.6
Sodium: 133 — ABNORMAL LOW

## 2011-09-11 LAB — MAGNESIUM
Magnesium: 2
Magnesium: 2
Magnesium: 2.4
Magnesium: 2.4
Magnesium: 2.1

## 2011-09-11 LAB — BASIC METABOLIC PANEL
BUN: 12
BUN: 12
BUN: 12
BUN: 12
BUN: 23
CO2: 29
CO2: 32
CO2: 33 — ABNORMAL HIGH
CO2: 38 — ABNORMAL HIGH
Calcium: 8.1 — ABNORMAL LOW
Calcium: 9.1
Calcium: 8.6
Calcium: 8.8
Calcium: 8.9
Chloride: 98
Chloride: 99
Chloride: 92 — ABNORMAL LOW
Chloride: 94 — ABNORMAL LOW
Chloride: 99
Creatinine, Ser: 1
Creatinine, Ser: 1.05
Creatinine, Ser: 0.96
Creatinine, Ser: 1.16
Creatinine, Ser: 1.23
GFR calc Af Amer: >60
GFR calc Af Amer: >60
GFR calc Af Amer: >60
GFR calc Af Amer: >60
GFR calc Af Amer: >60
GFR calc non Af Amer: >60
GFR calc non Af Amer: >60
GFR calc non Af Amer: >60
GFR calc non Af Amer: >60
GFR calc non Af Amer: >60
Glucose, Bld: 104 — ABNORMAL HIGH
Glucose, Bld: 118 — ABNORMAL HIGH
Glucose, Bld: 103 — ABNORMAL HIGH
Glucose, Bld: 114 — ABNORMAL HIGH
Glucose, Bld: 125 — ABNORMAL HIGH
Potassium: 4.3
Potassium: 3.8
Potassium: 3.9
Potassium: 4.5
Sodium: 134 — ABNORMAL LOW
Sodium: 135
Sodium: 133 — ABNORMAL LOW
Sodium: 137
Sodium: 138

## 2011-09-11 LAB — TYPE AND SCREEN
ABO/RH(D): A POS
Antibody Screen: NEGATIVE

## 2011-09-11 LAB — HEPARIN LEVEL (UNFRACTIONATED)
Heparin Unfractionated: 0.3
Heparin Unfractionated: 0.55
Heparin Unfractionated: 0.59
Heparin Unfractionated: 0.59
Heparin Unfractionated: <0.1 — ABNORMAL LOW
Heparin Unfractionated: <0.1 — ABNORMAL LOW

## 2011-09-11 LAB — DIFFERENTIAL
Basophils Absolute: 0.1
Basophils Relative: 1
Lymphocytes Relative: 40
Neutro Abs: 4.4
Neutrophils Relative %: 49

## 2011-09-11 LAB — POCT I-STAT GLUCOSE: Operator id: 3342

## 2011-09-11 LAB — COMPREHENSIVE METABOLIC PANEL
Alkaline Phosphatase: 84
BUN: 13
Chloride: 98
Creatinine, Ser: 1.07
GFR calc non Af Amer: >60
Glucose, Bld: 91
Potassium: 4.1
Total Bilirubin: 0.6

## 2011-09-11 LAB — CARDIAC PANEL(CRET KIN+CKTOT+MB+TROPI)
CK, MB: 5.1 — ABNORMAL HIGH
Relative Index: 1.9
Relative Index: 4 — ABNORMAL HIGH
Troponin I: 0.04
Troponin I: 0.13 — ABNORMAL HIGH
Troponin I: 0.17 — ABNORMAL HIGH

## 2011-09-11 LAB — HEMOGLOBIN AND HEMATOCRIT, BLOOD
HCT: 35.3 — ABNORMAL LOW
Hemoglobin: 12.3 — ABNORMAL LOW

## 2011-09-11 LAB — HEMOGLOBIN A1C
Hgb A1c MFr Bld: 6
Mean Plasma Glucose: 136

## 2011-09-11 LAB — BLOOD GAS, ARTERIAL
pCO2 arterial: 47.5 — ABNORMAL HIGH
pH, Arterial: 7.379

## 2011-09-11 LAB — CREATININE, SERUM
Creatinine, Ser: 1.01
Creatinine, Ser: 1.17
GFR calc Af Amer: >60
GFR calc Af Amer: >60
GFR calc non Af Amer: >60
GFR calc non Af Amer: >60

## 2011-09-11 LAB — PLATELET COUNT: Platelets: 195

## 2011-09-11 LAB — RAPID URINE DRUG SCREEN, HOSP PERFORMED
Benzodiazepines: NOT DETECTED
Cocaine: NOT DETECTED
Opiates: POSITIVE — AB
Tetrahydrocannabinol: NOT DETECTED

## 2011-09-11 LAB — APTT: aPTT: 31

## 2011-09-11 LAB — LIPID PANEL: HDL: 27 — ABNORMAL LOW

## 2016-05-03 DIAGNOSIS — M25572 Pain in left ankle and joints of left foot: Secondary | ICD-10-CM | POA: Diagnosis not present

## 2016-05-03 DIAGNOSIS — M79672 Pain in left foot: Secondary | ICD-10-CM | POA: Diagnosis not present

## 2016-05-03 DIAGNOSIS — I10 Essential (primary) hypertension: Secondary | ICD-10-CM | POA: Diagnosis not present

## 2016-05-03 DIAGNOSIS — Z6831 Body mass index (BMI) 31.0-31.9, adult: Secondary | ICD-10-CM | POA: Diagnosis not present

## 2017-11-01 DIAGNOSIS — E785 Hyperlipidemia, unspecified: Secondary | ICD-10-CM | POA: Diagnosis not present

## 2017-11-01 DIAGNOSIS — J209 Acute bronchitis, unspecified: Secondary | ICD-10-CM | POA: Diagnosis not present

## 2017-11-01 DIAGNOSIS — I25119 Atherosclerotic heart disease of native coronary artery with unspecified angina pectoris: Secondary | ICD-10-CM | POA: Diagnosis not present

## 2017-11-01 DIAGNOSIS — R079 Chest pain, unspecified: Secondary | ICD-10-CM | POA: Diagnosis not present

## 2017-11-01 DIAGNOSIS — Z951 Presence of aortocoronary bypass graft: Secondary | ICD-10-CM | POA: Diagnosis not present

## 2017-11-01 DIAGNOSIS — Z955 Presence of coronary angioplasty implant and graft: Secondary | ICD-10-CM | POA: Diagnosis not present

## 2017-11-01 DIAGNOSIS — I25118 Atherosclerotic heart disease of native coronary artery with other forms of angina pectoris: Secondary | ICD-10-CM | POA: Diagnosis not present

## 2017-11-01 DIAGNOSIS — Z7982 Long term (current) use of aspirin: Secondary | ICD-10-CM | POA: Diagnosis not present

## 2017-11-01 DIAGNOSIS — I10 Essential (primary) hypertension: Secondary | ICD-10-CM | POA: Diagnosis not present

## 2017-11-01 DIAGNOSIS — Z8052 Family history of malignant neoplasm of bladder: Secondary | ICD-10-CM | POA: Diagnosis not present

## 2017-11-01 DIAGNOSIS — J208 Acute bronchitis due to other specified organisms: Secondary | ICD-10-CM | POA: Diagnosis not present

## 2017-11-01 DIAGNOSIS — Z8249 Family history of ischemic heart disease and other diseases of the circulatory system: Secondary | ICD-10-CM | POA: Diagnosis not present

## 2017-11-01 DIAGNOSIS — J440 Chronic obstructive pulmonary disease with acute lower respiratory infection: Secondary | ICD-10-CM | POA: Diagnosis not present

## 2017-11-01 DIAGNOSIS — F17200 Nicotine dependence, unspecified, uncomplicated: Secondary | ICD-10-CM | POA: Diagnosis not present

## 2017-11-01 DIAGNOSIS — R0789 Other chest pain: Secondary | ICD-10-CM | POA: Diagnosis not present

## 2017-11-01 DIAGNOSIS — J441 Chronic obstructive pulmonary disease with (acute) exacerbation: Secondary | ICD-10-CM | POA: Diagnosis not present

## 2017-11-02 DIAGNOSIS — E785 Hyperlipidemia, unspecified: Secondary | ICD-10-CM | POA: Diagnosis not present

## 2017-11-02 DIAGNOSIS — R079 Chest pain, unspecified: Secondary | ICD-10-CM | POA: Diagnosis not present

## 2017-11-02 DIAGNOSIS — I25118 Atherosclerotic heart disease of native coronary artery with other forms of angina pectoris: Secondary | ICD-10-CM | POA: Diagnosis not present

## 2017-11-02 DIAGNOSIS — J209 Acute bronchitis, unspecified: Secondary | ICD-10-CM | POA: Diagnosis not present

## 2017-11-02 DIAGNOSIS — I10 Essential (primary) hypertension: Secondary | ICD-10-CM | POA: Diagnosis not present

## 2017-11-02 DIAGNOSIS — J441 Chronic obstructive pulmonary disease with (acute) exacerbation: Secondary | ICD-10-CM | POA: Diagnosis not present

## 2017-11-02 DIAGNOSIS — F17200 Nicotine dependence, unspecified, uncomplicated: Secondary | ICD-10-CM | POA: Diagnosis not present

## 2017-11-02 DIAGNOSIS — Z8249 Family history of ischemic heart disease and other diseases of the circulatory system: Secondary | ICD-10-CM | POA: Diagnosis not present

## 2017-11-02 DIAGNOSIS — J440 Chronic obstructive pulmonary disease with acute lower respiratory infection: Secondary | ICD-10-CM | POA: Diagnosis not present

## 2017-11-02 DIAGNOSIS — Z7982 Long term (current) use of aspirin: Secondary | ICD-10-CM | POA: Diagnosis not present

## 2017-11-02 DIAGNOSIS — Z955 Presence of coronary angioplasty implant and graft: Secondary | ICD-10-CM | POA: Diagnosis not present

## 2017-11-02 DIAGNOSIS — I25119 Atherosclerotic heart disease of native coronary artery with unspecified angina pectoris: Secondary | ICD-10-CM | POA: Diagnosis not present

## 2017-11-02 DIAGNOSIS — J208 Acute bronchitis due to other specified organisms: Secondary | ICD-10-CM | POA: Diagnosis not present

## 2017-11-02 DIAGNOSIS — Z8052 Family history of malignant neoplasm of bladder: Secondary | ICD-10-CM | POA: Diagnosis not present

## 2017-11-02 DIAGNOSIS — Z951 Presence of aortocoronary bypass graft: Secondary | ICD-10-CM | POA: Diagnosis not present

## 2017-11-04 DIAGNOSIS — I10 Essential (primary) hypertension: Secondary | ICD-10-CM | POA: Diagnosis not present

## 2017-11-04 DIAGNOSIS — R0789 Other chest pain: Secondary | ICD-10-CM | POA: Diagnosis not present

## 2017-11-04 DIAGNOSIS — J40 Bronchitis, not specified as acute or chronic: Secondary | ICD-10-CM | POA: Diagnosis not present

## 2017-11-04 DIAGNOSIS — Z6831 Body mass index (BMI) 31.0-31.9, adult: Secondary | ICD-10-CM | POA: Diagnosis not present

## 2018-09-16 DIAGNOSIS — Z955 Presence of coronary angioplasty implant and graft: Secondary | ICD-10-CM | POA: Diagnosis not present

## 2018-09-16 DIAGNOSIS — Z6831 Body mass index (BMI) 31.0-31.9, adult: Secondary | ICD-10-CM | POA: Diagnosis not present

## 2018-09-16 DIAGNOSIS — I2510 Atherosclerotic heart disease of native coronary artery without angina pectoris: Secondary | ICD-10-CM | POA: Diagnosis not present

## 2018-09-16 DIAGNOSIS — Z0000 Encounter for general adult medical examination without abnormal findings: Secondary | ICD-10-CM | POA: Diagnosis not present

## 2018-09-16 DIAGNOSIS — N202 Calculus of kidney with calculus of ureter: Secondary | ICD-10-CM | POA: Diagnosis not present

## 2018-09-16 DIAGNOSIS — I719 Aortic aneurysm of unspecified site, without rupture: Secondary | ICD-10-CM | POA: Diagnosis not present

## 2018-09-16 DIAGNOSIS — Z6829 Body mass index (BMI) 29.0-29.9, adult: Secondary | ICD-10-CM | POA: Diagnosis not present

## 2018-09-16 DIAGNOSIS — G629 Polyneuropathy, unspecified: Secondary | ICD-10-CM | POA: Diagnosis not present

## 2018-09-16 DIAGNOSIS — M545 Low back pain: Secondary | ICD-10-CM | POA: Diagnosis not present

## 2018-09-16 DIAGNOSIS — Z9889 Other specified postprocedural states: Secondary | ICD-10-CM | POA: Diagnosis not present

## 2018-09-16 DIAGNOSIS — J9600 Acute respiratory failure, unspecified whether with hypoxia or hypercapnia: Secondary | ICD-10-CM | POA: Diagnosis not present

## 2018-09-16 DIAGNOSIS — I10 Essential (primary) hypertension: Secondary | ICD-10-CM | POA: Diagnosis not present

## 2018-09-16 DIAGNOSIS — J984 Other disorders of lung: Secondary | ICD-10-CM | POA: Diagnosis not present

## 2018-09-16 DIAGNOSIS — Z7982 Long term (current) use of aspirin: Secondary | ICD-10-CM | POA: Diagnosis not present

## 2018-09-16 DIAGNOSIS — Z48812 Encounter for surgical aftercare following surgery on the circulatory system: Secondary | ICD-10-CM | POA: Diagnosis not present

## 2018-09-16 DIAGNOSIS — I713 Abdominal aortic aneurysm, ruptured: Secondary | ICD-10-CM | POA: Diagnosis not present

## 2018-09-16 DIAGNOSIS — Z791 Long term (current) use of non-steroidal anti-inflammatories (NSAID): Secondary | ICD-10-CM | POA: Diagnosis not present

## 2018-09-16 DIAGNOSIS — I252 Old myocardial infarction: Secondary | ICD-10-CM | POA: Diagnosis not present

## 2018-09-16 DIAGNOSIS — F17200 Nicotine dependence, unspecified, uncomplicated: Secondary | ICD-10-CM | POA: Diagnosis not present

## 2018-09-16 DIAGNOSIS — E669 Obesity, unspecified: Secondary | ICD-10-CM | POA: Diagnosis not present

## 2018-09-16 DIAGNOSIS — D62 Acute posthemorrhagic anemia: Secondary | ICD-10-CM | POA: Diagnosis not present

## 2018-09-16 DIAGNOSIS — Z951 Presence of aortocoronary bypass graft: Secondary | ICD-10-CM | POA: Diagnosis not present

## 2018-09-17 DIAGNOSIS — I713 Abdominal aortic aneurysm, ruptured: Secondary | ICD-10-CM | POA: Diagnosis not present

## 2018-09-17 DIAGNOSIS — Z48812 Encounter for surgical aftercare following surgery on the circulatory system: Secondary | ICD-10-CM | POA: Diagnosis not present

## 2018-09-17 DIAGNOSIS — I2510 Atherosclerotic heart disease of native coronary artery without angina pectoris: Secondary | ICD-10-CM | POA: Diagnosis not present

## 2018-09-17 DIAGNOSIS — J9600 Acute respiratory failure, unspecified whether with hypoxia or hypercapnia: Secondary | ICD-10-CM | POA: Diagnosis not present

## 2018-09-18 DIAGNOSIS — I10 Essential (primary) hypertension: Secondary | ICD-10-CM | POA: Diagnosis not present

## 2018-09-18 DIAGNOSIS — I713 Abdominal aortic aneurysm, ruptured: Secondary | ICD-10-CM | POA: Diagnosis not present

## 2018-09-18 DIAGNOSIS — I2510 Atherosclerotic heart disease of native coronary artery without angina pectoris: Secondary | ICD-10-CM | POA: Diagnosis not present

## 2018-09-18 DIAGNOSIS — Z9889 Other specified postprocedural states: Secondary | ICD-10-CM | POA: Diagnosis not present

## 2018-09-19 DIAGNOSIS — Z9889 Other specified postprocedural states: Secondary | ICD-10-CM | POA: Diagnosis not present

## 2018-09-19 DIAGNOSIS — I713 Abdominal aortic aneurysm, ruptured: Secondary | ICD-10-CM | POA: Diagnosis not present

## 2018-09-19 DIAGNOSIS — I10 Essential (primary) hypertension: Secondary | ICD-10-CM | POA: Diagnosis not present

## 2018-09-19 DIAGNOSIS — I2510 Atherosclerotic heart disease of native coronary artery without angina pectoris: Secondary | ICD-10-CM | POA: Diagnosis not present

## 2018-10-01 DIAGNOSIS — I714 Abdominal aortic aneurysm, without rupture: Secondary | ICD-10-CM | POA: Diagnosis not present

## 2018-10-01 DIAGNOSIS — Z6830 Body mass index (BMI) 30.0-30.9, adult: Secondary | ICD-10-CM | POA: Diagnosis not present

## 2018-10-02 DIAGNOSIS — I714 Abdominal aortic aneurysm, without rupture: Secondary | ICD-10-CM | POA: Diagnosis not present

## 2018-10-02 DIAGNOSIS — Z125 Encounter for screening for malignant neoplasm of prostate: Secondary | ICD-10-CM | POA: Diagnosis not present

## 2018-10-27 DIAGNOSIS — I714 Abdominal aortic aneurysm, without rupture: Secondary | ICD-10-CM | POA: Diagnosis not present

## 2018-10-27 DIAGNOSIS — Z9889 Other specified postprocedural states: Secondary | ICD-10-CM | POA: Diagnosis not present

## 2018-10-27 DIAGNOSIS — I745 Embolism and thrombosis of iliac artery: Secondary | ICD-10-CM | POA: Diagnosis not present

## 2018-11-03 DIAGNOSIS — Z6829 Body mass index (BMI) 29.0-29.9, adult: Secondary | ICD-10-CM | POA: Diagnosis not present

## 2018-11-03 DIAGNOSIS — I714 Abdominal aortic aneurysm, without rupture: Secondary | ICD-10-CM | POA: Diagnosis not present

## 2018-11-03 DIAGNOSIS — G603 Idiopathic progressive neuropathy: Secondary | ICD-10-CM | POA: Diagnosis not present

## 2018-11-04 ENCOUNTER — Other Ambulatory Visit (HOSPITAL_COMMUNITY): Payer: Self-pay | Admitting: Internal Medicine

## 2018-11-04 DIAGNOSIS — G459 Transient cerebral ischemic attack, unspecified: Secondary | ICD-10-CM

## 2018-11-07 ENCOUNTER — Ambulatory Visit (HOSPITAL_COMMUNITY)
Admission: RE | Admit: 2018-11-07 | Discharge: 2018-11-07 | Disposition: A | Discharge status: Home / Self Care | Payer: BLUE CROSS/BLUE SHIELD | Source: Ambulatory Visit | Attending: Internal Medicine | Admitting: Internal Medicine

## 2018-11-07 DIAGNOSIS — G459 Transient cerebral ischemic attack, unspecified: Secondary | ICD-10-CM | POA: Insufficient documentation

## 2018-11-10 DIAGNOSIS — I713 Abdominal aortic aneurysm, ruptured: Secondary | ICD-10-CM | POA: Diagnosis not present

## 2018-12-15 DIAGNOSIS — I713 Abdominal aortic aneurysm, ruptured: Secondary | ICD-10-CM | POA: Diagnosis not present

## 2019-02-05 DIAGNOSIS — Z6829 Body mass index (BMI) 29.0-29.9, adult: Secondary | ICD-10-CM | POA: Diagnosis not present

## 2019-02-05 DIAGNOSIS — L570 Actinic keratosis: Secondary | ICD-10-CM | POA: Diagnosis not present

## 2019-02-05 DIAGNOSIS — E7849 Other hyperlipidemia: Secondary | ICD-10-CM | POA: Diagnosis not present

## 2019-05-15 DIAGNOSIS — Z6830 Body mass index (BMI) 30.0-30.9, adult: Secondary | ICD-10-CM | POA: Diagnosis not present

## 2019-05-15 DIAGNOSIS — I77811 Abdominal aortic ectasia: Secondary | ICD-10-CM | POA: Diagnosis not present

## 2019-05-15 DIAGNOSIS — I10 Essential (primary) hypertension: Secondary | ICD-10-CM | POA: Diagnosis not present

## 2019-05-15 DIAGNOSIS — E7849 Other hyperlipidemia: Secondary | ICD-10-CM | POA: Diagnosis not present

## 2019-08-03 DIAGNOSIS — Z6830 Body mass index (BMI) 30.0-30.9, adult: Secondary | ICD-10-CM | POA: Diagnosis not present

## 2019-08-03 DIAGNOSIS — R1084 Generalized abdominal pain: Secondary | ICD-10-CM | POA: Diagnosis not present

## 2019-08-03 DIAGNOSIS — R109 Unspecified abdominal pain: Secondary | ICD-10-CM | POA: Diagnosis not present

## 2019-08-06 DIAGNOSIS — N289 Disorder of kidney and ureter, unspecified: Secondary | ICD-10-CM | POA: Diagnosis not present

## 2019-08-06 DIAGNOSIS — I713 Abdominal aortic aneurysm, ruptured: Secondary | ICD-10-CM | POA: Diagnosis not present

## 2019-08-06 DIAGNOSIS — I719 Aortic aneurysm of unspecified site, without rupture: Secondary | ICD-10-CM | POA: Diagnosis not present

## 2019-08-06 DIAGNOSIS — R109 Unspecified abdominal pain: Secondary | ICD-10-CM | POA: Diagnosis not present

## 2019-08-06 DIAGNOSIS — I700 Atherosclerosis of aorta: Secondary | ICD-10-CM | POA: Diagnosis not present

## 2019-08-06 DIAGNOSIS — K5730 Diverticulosis of large intestine without perforation or abscess without bleeding: Secondary | ICD-10-CM | POA: Diagnosis not present

## 2019-08-06 DIAGNOSIS — K7689 Other specified diseases of liver: Secondary | ICD-10-CM | POA: Diagnosis not present

## 2019-11-03 DIAGNOSIS — Z6829 Body mass index (BMI) 29.0-29.9, adult: Secondary | ICD-10-CM | POA: Diagnosis not present

## 2019-11-03 DIAGNOSIS — Z0000 Encounter for general adult medical examination without abnormal findings: Secondary | ICD-10-CM | POA: Diagnosis not present

## 2019-11-06 DIAGNOSIS — R7303 Prediabetes: Secondary | ICD-10-CM | POA: Diagnosis not present

## 2019-11-06 DIAGNOSIS — Z0000 Encounter for general adult medical examination without abnormal findings: Secondary | ICD-10-CM | POA: Diagnosis not present

## 2019-11-06 DIAGNOSIS — Z125 Encounter for screening for malignant neoplasm of prostate: Secondary | ICD-10-CM | POA: Diagnosis not present

## 2019-12-22 DIAGNOSIS — Z6830 Body mass index (BMI) 30.0-30.9, adult: Secondary | ICD-10-CM | POA: Diagnosis not present

## 2019-12-22 DIAGNOSIS — M10022 Idiopathic gout, left elbow: Secondary | ICD-10-CM | POA: Diagnosis not present

## 2020-02-03 DIAGNOSIS — H0231 Blepharochalasis right upper eyelid: Secondary | ICD-10-CM | POA: Diagnosis not present

## 2020-02-03 DIAGNOSIS — I10 Essential (primary) hypertension: Secondary | ICD-10-CM | POA: Diagnosis not present

## 2020-02-03 DIAGNOSIS — E7849 Other hyperlipidemia: Secondary | ICD-10-CM | POA: Diagnosis not present

## 2020-02-03 DIAGNOSIS — M10022 Idiopathic gout, left elbow: Secondary | ICD-10-CM | POA: Diagnosis not present

## 2020-04-30 IMAGING — US US CAROTID DUPLEX BILAT
1 series · 13 of 24 positions shown · non-contrast
Comparison: None.

CLINICAL DATA: Right-sided weakness. TIA. History of CAD (post
myocardial infarction), hypertension and smoking.

EXAM:
BILATERAL CAROTID DUPLEX ULTRASOUND
TECHNIQUE: Gray scale imaging, color Doppler and duplex ultrasound were
performed of bilateral carotid and vertebral arteries in the neck.

[Series 1: us carotid duplex bilat · 13 of 68 slices shown]
[im 1/68]
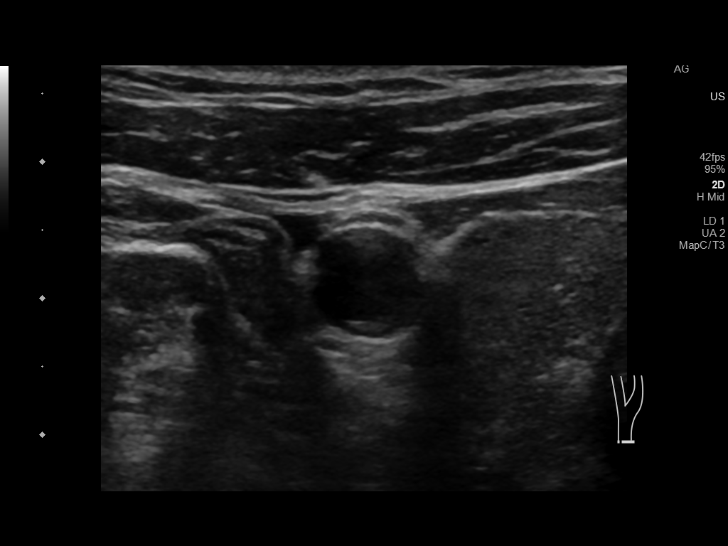
[im 6/68]
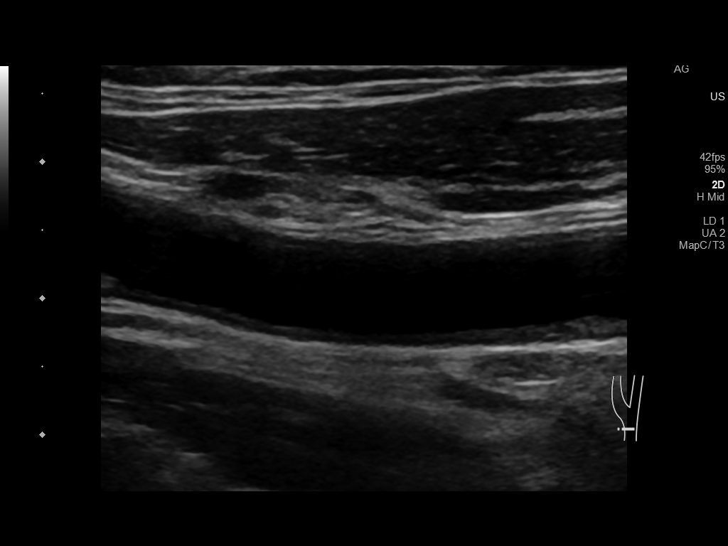
[im 12/68]
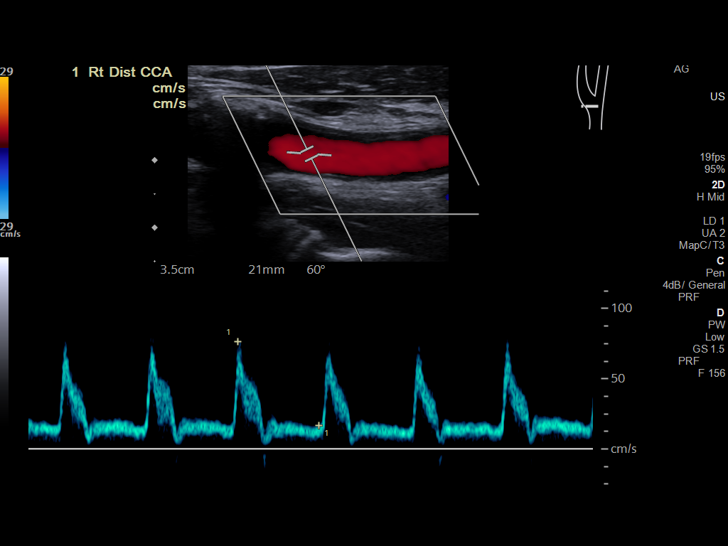
[im 18/68]
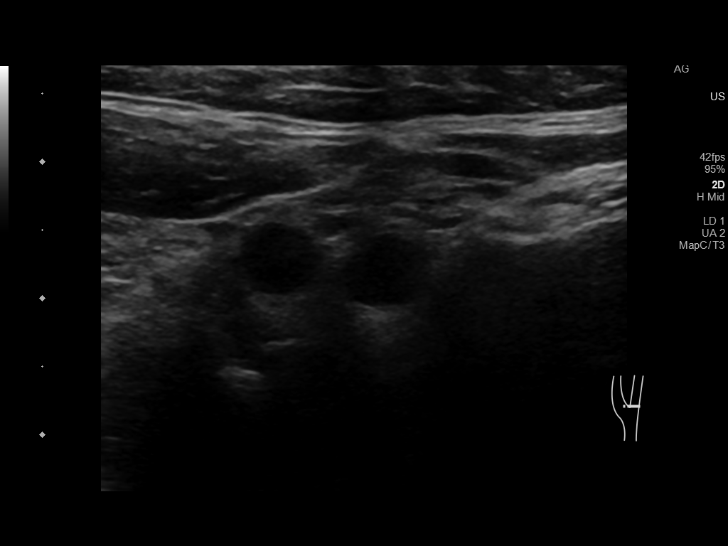
[im 24/68]
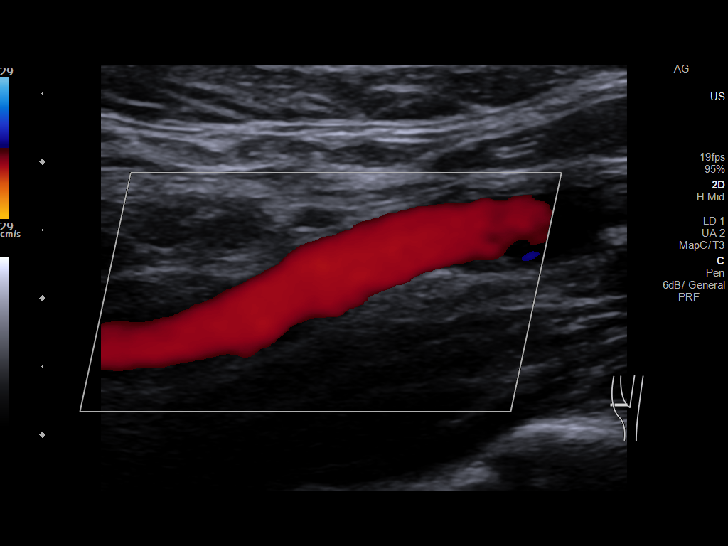
[im 30/68]
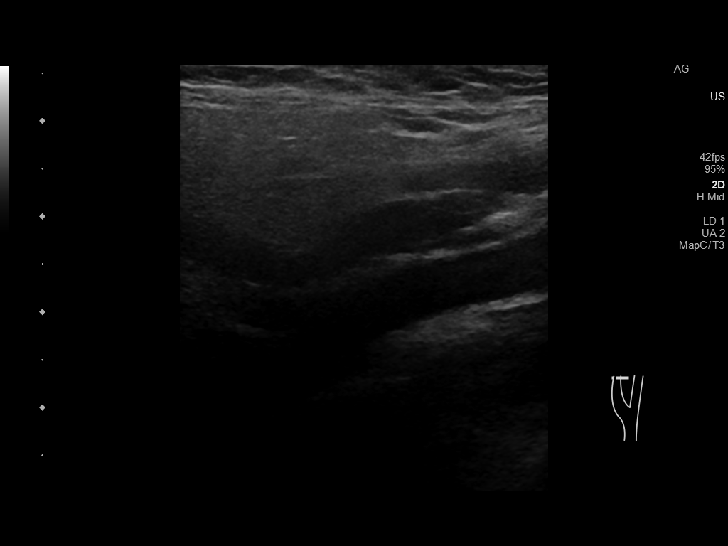
[im 35/68]
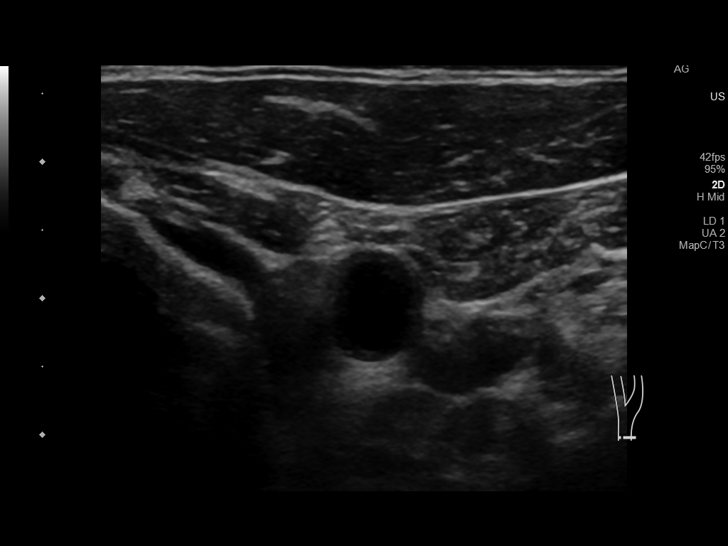
[im 38/68]
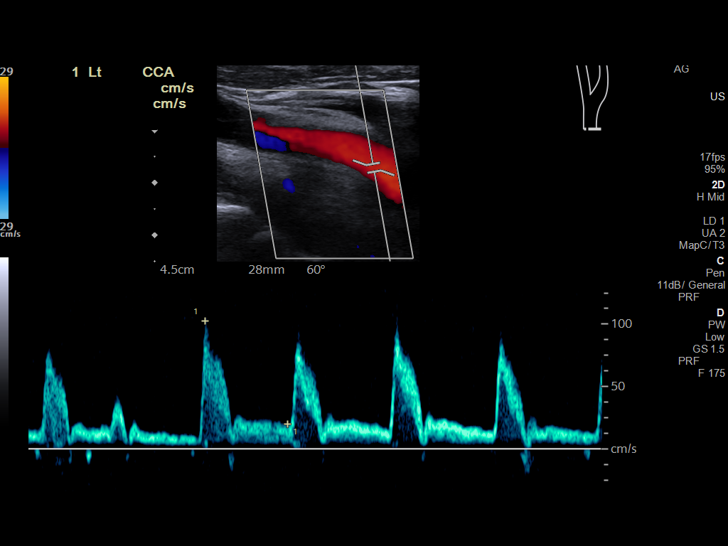
[im 44/68]
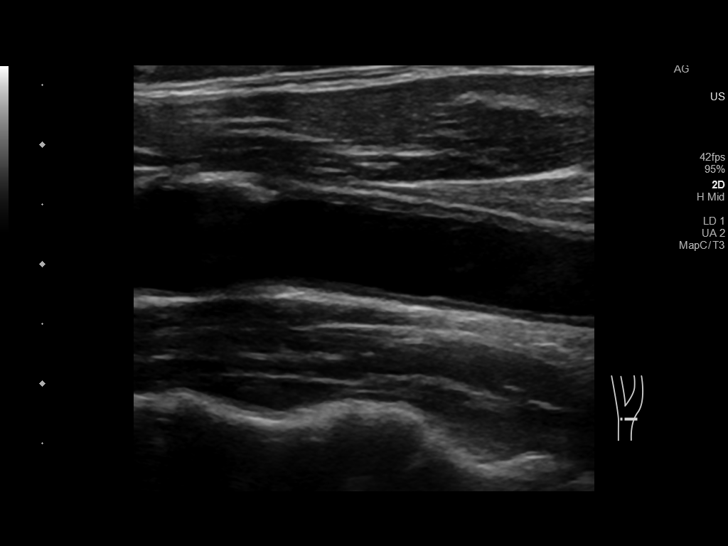
[im 50/68]
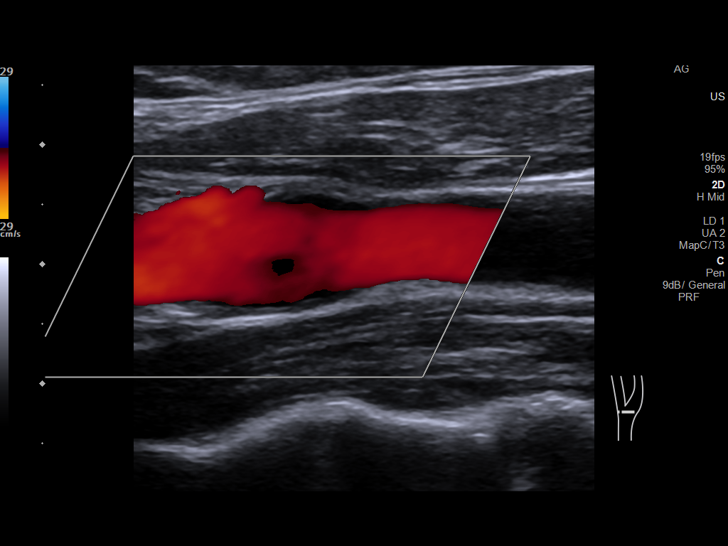
[im 56/68]
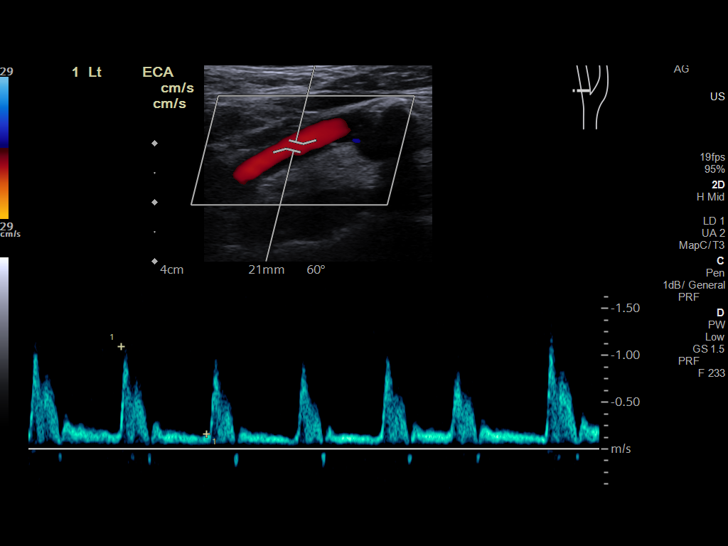
[im 62/68]
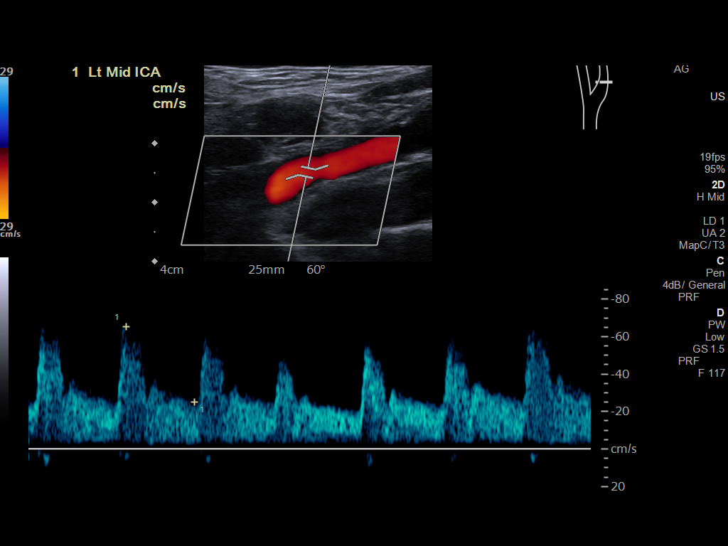
[im 68/68]
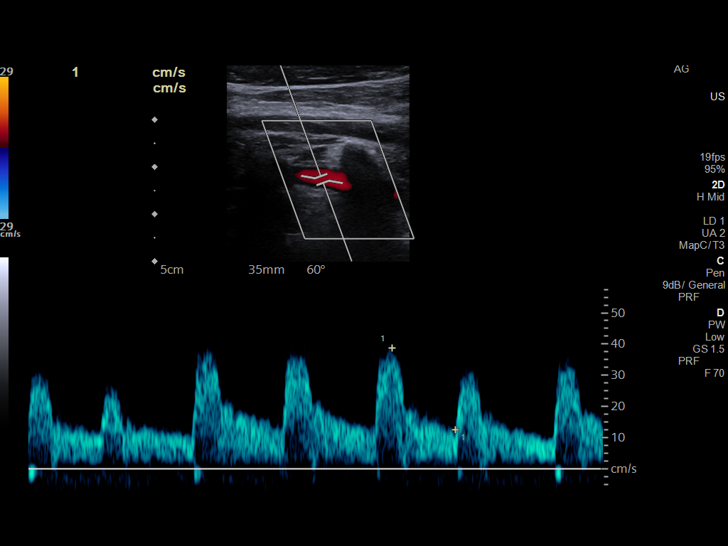

[13 of 24 positions shown; findings below may reference images not displayed]

FINDINGS: Criteria: Quantification of carotid stenosis is based on velocity
parameters that correlate the residual internal carotid diameter
with NASCET-based stenosis levels, using the diameter of the distal
internal carotid lumen as the denominator for stenosis measurement.

The following velocity measurements were obtained:

RIGHT

ICA:  66/25 cm/sec

CCA:  70/12 cm/sec

SYSTOLIC ICA/CCA RATIO:

ECA:  153 cm/sec

LEFT

ICA:  67/25 cm/sec

CCA:  74/14 cm/sec

SYSTOLIC ICA/CCA RATIO:

ECA:  109 cm/sec

RIGHT CAROTID ARTERY: There is circumferential intimal thickening
throughout the right common carotid artery (representative images 3,
7 and 11). There is a minimal amount of eccentric echogenic plaque
within the right carotid bulb (images 14 and 16), extending to
involve the origin and proximal aspects of the right internal
carotid artery, not resulting in elevated peak systolic velocities
within the interrogated course of the right internal carotid artery
to suggest a hemodynamically significant stenosis

RIGHT VERTEBRAL ARTERY:  Antegrade flow

LEFT CAROTID ARTERY: There is circumferential intimal wall
thickening throughout the left common carotid artery (images 37, 41
and 45). There is a minimal amount of eccentric echogenic plaque
within the left carotid bulb (images 48 and 50), extending to
involve the origin and proximal aspects of the left internal carotid
artery (image 58), not resulting in elevated peak systolic
velocities within the interrogated course of the left internal
carotid artery to suggest a hemodynamically significant stenosis.

LEFT VERTEBRAL ARTERY:  Antegrade flow
IMPRESSION: Bilateral atherosclerotic plaque, left subjectively greater than
right, not resulting in a hemodynamically significant stenosis
within either internal carotid artery.

## 2020-05-02 DIAGNOSIS — E7849 Other hyperlipidemia: Secondary | ICD-10-CM | POA: Diagnosis not present

## 2020-05-02 DIAGNOSIS — M545 Low back pain: Secondary | ICD-10-CM | POA: Diagnosis not present

## 2020-05-02 DIAGNOSIS — I10 Essential (primary) hypertension: Secondary | ICD-10-CM | POA: Diagnosis not present

## 2020-05-02 DIAGNOSIS — M10022 Idiopathic gout, left elbow: Secondary | ICD-10-CM | POA: Diagnosis not present

## 2022-06-14 ENCOUNTER — Encounter: Payer: Self-pay | Admitting: Orthopaedic Surgery

## 2022-06-14 ENCOUNTER — Ambulatory Visit (INDEPENDENT_AMBULATORY_CARE_PROVIDER_SITE_OTHER): Payer: Medicaid Other | Admitting: Orthopaedic Surgery

## 2022-06-14 DIAGNOSIS — S82392A Other fracture of lower end of left tibia, initial encounter for closed fracture: Secondary | ICD-10-CM

## 2022-06-14 DIAGNOSIS — S82399A Other fracture of lower end of unspecified tibia, initial encounter for closed fracture: Secondary | ICD-10-CM | POA: Insufficient documentation

## 2022-06-14 NOTE — Progress Notes (Signed)
Office Visit Note   Patient: [name]           Date of Birth: 1961-03-23           MRN: [id number] Visit Date: 06/14/2022              Requested by: Toma Deiters, MD [address] [place],  [place] [address] PCP: Toma Deiters, MD   Assessment & Plan: Visit Diagnoses:  1. Closed intra-articular fracture of distal end of left tibia, initial encounter     Plan: Short leg fiberglass cast nonweightbearing applied.  Return 5 weeks cast off x-rays of the ankle and he will bring his cam boot with likely placement in the cam boot and then we could begin some weightbearing.  Until then he is strictly nonweightbearing for 5 weeks .    GLOBAL  Follow-Up Instructions: No follow-ups on file.   Orders:  No orders of the defined types were placed in this encounter.  No orders of the defined types were placed in this encounter.     Procedures: No procedures performed   Clinical Data: No additional findings.   Subjective: Chief Complaint  Patient presents with   Left Leg - Fracture    Tib/fib fx  DOI: 06/07/22    HPI 61 year old male with injury 06/07/2022 when he was standing on a 5 pound paint bucket turned over and doing some painting and the bucket flipped over he suffered a left closed distal pilon fracture essentially nondisplaced intra-articular.  He sought care in the emergency room 06/12/2022 where x-rays documented the distal tibia closed fracture of fibula was intact.  He has had mild swelling within the splint had a taken off.  He was placed in a cam boot but had significant increased pain and then was placed back in a splint he states he has not actually seen the orthopedist and was referred to me by Dr.Hasanaj the patient had asked to have his care transferred he states an MRI scan was ordered.  X-rays are reviewed which shows nondisplaced pilon intra-articular fracture with intact fibula and no widening of the medial clear space.  Patient wants to get his ankle  healed so he can get back riding motorcycles.  Patient is here with his wife.  Review of Systems positive history of MI hyperlipidemia previous diagnosis COPD all systems noncontributory HPI.   Objective: Vital Signs: BP 134/76   Pulse 82   Ht 5\' 10"  (1.778 m)   Wt 220 lb (99.8 kg)   BMI 31.57 kg/m   Physical Exam Constitutional:      Appearance: He is well-developed.  HENT:     Head: Normocephalic and atraumatic.     Right Ear: External ear normal.     Left Ear: External ear normal.  Eyes:     Pupils: Pupils are equal, round, and reactive to light.  Neck:     Thyroid: No thyromegaly.     Trachea: No tracheal deviation.  Cardiovascular:     Rate and Rhythm: Normal rate.  Pulmonary:     Effort: Pulmonary effort is normal.     Breath sounds: No wheezing.  Abdominal:     General: Bowel sounds are normal.     Palpations: Abdomen is soft.  Musculoskeletal:     Cervical back: Neck supple.  Skin:    General: Skin is warm and dry.     Capillary Refill: Capillary refill takes less than 2 seconds.  Neurological:  Mental Status: He is alert and oriented to person, place, and time.  Psychiatric:        Behavior: Behavior normal.        Thought Content: Thought content normal.        Judgment: Judgment normal.     Ortho Exam some ankle effusion/hemarthrosis noted mild foot swelling.  Good capillary refill.  Patient's been in a short leg splint.  Specialty Comments:  No specialty comments available.  Imaging: No results found.   PMFS History: Patient Active Problem List   Diagnosis Date Noted   Closed intra-articular fracture of distal tibia 06/14/2022   HYPERLIPIDEMIA 05/12/2008   OBSTRUCTIVE SLEEP APNEA 05/12/2008   MYOCARDIAL INFARCTION 05/12/2008   Past Medical History:  Diagnosis Date   BPH (benign prostatic hyperplasia)    DM (diabetes mellitus) (HCC)    Hyperlipidemia    Hypertension     No family history on file.  History reviewed. No pertinent  surgical history. Social History   Occupational History   Not on file  Tobacco Use   Smoking status: Every Day    Packs/day: 1.00    Types: Cigarettes   Smokeless tobacco: Never  Substance and Sexual Activity   Alcohol use: Yes   Drug use: Never   Sexual activity: Not on file

## 2022-06-21 ENCOUNTER — Other Ambulatory Visit: Payer: Self-pay | Admitting: Orthopaedic Surgery

## 2022-06-21 ENCOUNTER — Telehealth: Payer: Self-pay

## 2022-06-21 MED ORDER — OXYCODONE-ACETAMINOPHEN 5-325 MG PO TABS: 1.0000 | ORAL_TABLET | Freq: Three times a day (TID) | ORAL | 0 refills | Status: AC | PRN

## 2022-06-21 NOTE — Telephone Encounter (Signed)
Patient called stated he is in a lot of pain and ran out of his pain medication yesterday. Wanting refill sent to [facility] in [place].

## 2022-06-22 NOTE — Telephone Encounter (Signed)
LM

## 2022-06-28 ENCOUNTER — Ambulatory Visit (INDEPENDENT_AMBULATORY_CARE_PROVIDER_SITE_OTHER): Payer: Medicaid Other | Admitting: Orthopaedic Surgery

## 2022-06-28 DIAGNOSIS — S82392D Other fracture of lower end of left tibia, subsequent encounter for closed fracture with routine healing: Secondary | ICD-10-CM

## 2022-06-28 NOTE — Progress Notes (Signed)
Follow-up distal tibia fracture patient is closely nonweightbearing he has been doing some toe-touch anterior plantar surface of the calf is soft and it was repaired today.  We reinforced nonweightbearing.  He will return in 3 weeks for cast off three-view x-rays left ankle and likely cam boot.

## 2022-07-19 ENCOUNTER — Ambulatory Visit (INDEPENDENT_AMBULATORY_CARE_PROVIDER_SITE_OTHER): Payer: Medicaid Other | Admitting: Orthopaedic Surgery

## 2022-07-19 ENCOUNTER — Ambulatory Visit: Payer: Self-pay

## 2022-07-19 DIAGNOSIS — S82392D Other fracture of lower end of left tibia, subsequent encounter for closed fracture with routine healing: Secondary | ICD-10-CM

## 2022-07-19 NOTE — Progress Notes (Signed)
   Post-Op Visit Note   Patient: [name]           Date of Birth: 03-09-1961           MRN: [id number] Visit Date: 07/19/2022 PCP: Toma Deiters, MD   Assessment & Plan:Global.  Follow-up nondisplaced distal tibia intra-articular fracture date of injury 06/07/2022.  Fracture line still visualized with some sclerotic changes.  We will place him in a cam boot he can remove it to work on gentle range of motion and can apply lotion to his leg.  Return 5 weeks repeat x-rays AP and lateral 2 view only distal tibia on return.  Chief Complaint:  Chief Complaint  Patient presents with   Left Leg - Fracture, Follow-up    DOI 06/07/22   Visit Diagnoses:  1. Closed intra-articular fracture of distal end of left tibia with routine healing, subsequent encounter     Plan: ROV 5 wks  Follow-Up Instructions: No follow-ups on file.   Orders:  Orders Placed This Encounter  Procedures   XR Ankle Complete Left   No orders of the defined types were placed in this encounter.   Imaging: No results found.  PMFS History: Patient Active Problem List   Diagnosis Date Noted   Closed intra-articular fracture of distal tibia 06/14/2022   HYPERLIPIDEMIA 05/12/2008   OBSTRUCTIVE SLEEP APNEA 05/12/2008   MYOCARDIAL INFARCTION 05/12/2008   Past Medical History:  Diagnosis Date   BPH (benign prostatic hyperplasia)    DM (diabetes mellitus) (HCC)    Hyperlipidemia    Hypertension     No family history on file.  No past surgical history on file. Social History   Occupational History   Not on file  Tobacco Use   Smoking status: Every Day    Packs/day: 1.00    Types: Cigarettes   Smokeless tobacco: Never  Substance and Sexual Activity   Alcohol use: Yes   Drug use: Never   Sexual activity: Not on file

## 2022-08-23 ENCOUNTER — Encounter: Payer: Self-pay | Admitting: Orthopaedic Surgery

## 2022-08-23 ENCOUNTER — Ambulatory Visit (INDEPENDENT_AMBULATORY_CARE_PROVIDER_SITE_OTHER): Payer: Medicaid Other

## 2022-08-23 ENCOUNTER — Ambulatory Visit (INDEPENDENT_AMBULATORY_CARE_PROVIDER_SITE_OTHER): Payer: Medicaid Other | Admitting: Orthopaedic Surgery

## 2022-08-23 VITALS — Ht 70.0 in | Wt 240.0 lb

## 2022-08-23 DIAGNOSIS — S82392D Other fracture of lower end of left tibia, subsequent encounter for closed fracture with routine healing: Secondary | ICD-10-CM | POA: Diagnosis not present

## 2022-08-23 NOTE — Progress Notes (Signed)
   Post-Op Visit Note   Patient: [name]           Date of Birth: 1961-01-03           MRN: [id number] Visit Date: 08/23/2022 PCP: Toma Deiters, MD   Assessment & Plan: Postop left distal tibia intra-articular fracture nondisplaced treated with a boot date of injury 06/07/2022.  X-rays today demonstrate progressive healing with callus formation and fracture remains that nondisplaced.  Only visualized on the lateral with tiny crack without step-off.  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  He is happy the results of treatment and can return on an as-needed basis.  Chief Complaint:  Chief Complaint  Patient presents with   Left Leg - Fracture, Follow-up    DOI 06/07/2022   Visit Diagnoses:  1. Closed intra-articular fracture of distal end of left tibia with routine healing, subsequent encounter     Plan: Return as needed.  Patient is ambulates without a limp.  Follow-Up Instructions: No follow-ups on file.   Orders:  Orders Placed This Encounter  Procedures   XR Ankle 2 Views Left   No orders of the defined types were placed in this encounter.   Imaging: No results found.  PMFS History: Patient Active Problem List   Diagnosis Date Noted   Closed intra-articular fracture of distal tibia 06/14/2022   HYPERLIPIDEMIA 05/12/2008   OBSTRUCTIVE SLEEP APNEA 05/12/2008   MYOCARDIAL INFARCTION 05/12/2008   Past Medical History:  Diagnosis Date   BPH (benign prostatic hyperplasia)    DM (diabetes mellitus) (HCC)    Hyperlipidemia    Hypertension     No family history on file.  No past surgical history on file. Social History   Occupational History   Not on file  Tobacco Use   Smoking status: Every Day    Packs/day: 1.00    Types: Cigarettes   Smokeless tobacco: Never  Substance and Sexual Activity   Alcohol use: Yes   Drug use: Never   Sexual  activity: Not on file

## 2024-11-05 ENCOUNTER — Encounter (INDEPENDENT_AMBULATORY_CARE_PROVIDER_SITE_OTHER): Payer: Self-pay | Admitting: *Deleted
